# Patient Record
Sex: Male | Born: 1935 | Race: White | Hispanic: No | Smoking: Current every day smoker
Health system: Southern US, Community
[De-identification: ages and names within clinical notes are randomized; demographics above are authoritative.]

## PROBLEM LIST (undated history)

## (undated) DIAGNOSIS — D649 Anemia, unspecified: Secondary | ICD-10-CM

## (undated) DIAGNOSIS — E119 Type 2 diabetes mellitus without complications: Secondary | ICD-10-CM

## (undated) DIAGNOSIS — C4442 Squamous cell carcinoma of skin of scalp and neck: Secondary | ICD-10-CM

## (undated) DIAGNOSIS — Z9861 Coronary angioplasty status: Secondary | ICD-10-CM

## (undated) DIAGNOSIS — N289 Disorder of kidney and ureter, unspecified: Secondary | ICD-10-CM

## (undated) DIAGNOSIS — IMO0002 Reserved for concepts with insufficient information to code with codable children: Secondary | ICD-10-CM

## (undated) DIAGNOSIS — C189 Malignant neoplasm of colon, unspecified: Secondary | ICD-10-CM

## (undated) DIAGNOSIS — I2510 Atherosclerotic heart disease of native coronary artery without angina pectoris: Secondary | ICD-10-CM

## (undated) DIAGNOSIS — E785 Hyperlipidemia, unspecified: Secondary | ICD-10-CM

## (undated) DIAGNOSIS — I2109 ST elevation (STEMI) myocardial infarction involving other coronary artery of anterior wall: Secondary | ICD-10-CM

## (undated) DIAGNOSIS — H9190 Unspecified hearing loss, unspecified ear: Secondary | ICD-10-CM

## (undated) DIAGNOSIS — E039 Hypothyroidism, unspecified: Secondary | ICD-10-CM

## (undated) DIAGNOSIS — IMO0001 Reserved for inherently not codable concepts without codable children: Secondary | ICD-10-CM

## (undated) DIAGNOSIS — L570 Actinic keratosis: Secondary | ICD-10-CM

## (undated) DIAGNOSIS — E669 Obesity, unspecified: Secondary | ICD-10-CM

## (undated) DIAGNOSIS — I255 Ischemic cardiomyopathy: Secondary | ICD-10-CM

## (undated) HISTORY — DX: Unspecified hearing loss, unspecified ear: H91.90

## (undated) HISTORY — DX: Actinic keratosis: L57.0

## (undated) HISTORY — DX: Coronary angioplasty status: Z98.61

## (undated) HISTORY — DX: Atherosclerotic heart disease of native coronary artery without angina pectoris: I25.10

## (undated) HISTORY — DX: Hyperlipidemia, unspecified: E78.5

## (undated) HISTORY — DX: Obesity, unspecified: E66.9

## (undated) HISTORY — DX: Type 2 diabetes mellitus without complications: E11.9

## (undated) HISTORY — DX: Hypothyroidism, unspecified: E03.9

## (undated) HISTORY — DX: Reserved for inherently not codable concepts without codable children: IMO0001

## (undated) HISTORY — DX: Anemia, unspecified: D64.9

## (undated) HISTORY — DX: Disorder of kidney and ureter, unspecified: N28.9

## (undated) HISTORY — DX: Malignant neoplasm of colon, unspecified: C18.9

## (undated) HISTORY — DX: Squamous cell carcinoma of skin of scalp and neck: C44.42

## (undated) HISTORY — DX: Reserved for concepts with insufficient information to code with codable children: IMO0002

## (undated) HISTORY — DX: Ischemic cardiomyopathy: I25.5

## (undated) HISTORY — DX: ST elevation (STEMI) myocardial infarction involving other coronary artery of anterior wall: I21.09

---

## 2000-08-11 ENCOUNTER — Encounter: Payer: Self-pay | Admitting: Ophthalmology

## 2000-08-11 ENCOUNTER — Ambulatory Visit: Admission: RE | Admit: 2000-08-11 | Discharge: 2000-08-11 | Payer: Self-pay | Admitting: Ophthalmology

## 2000-08-22 ENCOUNTER — Encounter: Admission: RE | Admit: 2000-08-22 | Discharge: 2000-11-20 | Payer: Self-pay | Admitting: Family Medicine

## 2000-08-23 ENCOUNTER — Ambulatory Visit (HOSPITAL_COMMUNITY): Admission: RE | Admit: 2000-08-23 | Discharge: 2000-08-23 | Payer: Self-pay | Admitting: Ophthalmology

## 2001-03-15 HISTORY — PX: COLECTOMY: SHX59

## 2001-03-15 HISTORY — PX: CARDIAC CATHETERIZATION: SHX172

## 2001-08-13 DIAGNOSIS — I2109 ST elevation (STEMI) myocardial infarction involving other coronary artery of anterior wall: Secondary | ICD-10-CM

## 2001-08-13 HISTORY — DX: ST elevation (STEMI) myocardial infarction involving other coronary artery of anterior wall: I21.09

## 2001-08-14 ENCOUNTER — Inpatient Hospital Stay (HOSPITAL_COMMUNITY): Admission: EM | Admit: 2001-08-14 | Discharge: 2001-08-17 | Payer: Self-pay | Admitting: Emergency Medicine

## 2001-08-14 ENCOUNTER — Encounter: Payer: Self-pay | Admitting: Emergency Medicine

## 2001-08-15 ENCOUNTER — Encounter: Payer: Self-pay | Admitting: Cardiology

## 2001-08-16 ENCOUNTER — Encounter (INDEPENDENT_AMBULATORY_CARE_PROVIDER_SITE_OTHER): Payer: Self-pay | Admitting: Cardiology

## 2001-08-21 ENCOUNTER — Encounter: Payer: Self-pay | Admitting: Cardiology

## 2001-08-21 ENCOUNTER — Inpatient Hospital Stay (HOSPITAL_COMMUNITY): Admission: AD | Admit: 2001-08-21 | Discharge: 2001-08-30 | Payer: Self-pay | Admitting: Cardiology

## 2001-08-21 ENCOUNTER — Encounter: Admission: RE | Admit: 2001-08-21 | Discharge: 2001-08-21 | Payer: Self-pay | Admitting: Cardiology

## 2001-08-25 ENCOUNTER — Encounter: Payer: Self-pay | Admitting: Cardiology

## 2001-08-28 ENCOUNTER — Encounter: Payer: Self-pay | Admitting: Cardiology

## 2001-09-12 DIAGNOSIS — I2510 Atherosclerotic heart disease of native coronary artery without angina pectoris: Secondary | ICD-10-CM

## 2001-09-12 HISTORY — DX: Atherosclerotic heart disease of native coronary artery without angina pectoris: I25.10

## 2001-09-19 ENCOUNTER — Ambulatory Visit (HOSPITAL_COMMUNITY): Admission: RE | Admit: 2001-09-19 | Discharge: 2001-09-19 | Payer: Self-pay | Admitting: Gastroenterology

## 2001-09-20 ENCOUNTER — Encounter: Admission: RE | Admit: 2001-09-20 | Discharge: 2001-09-20 | Payer: Self-pay | Admitting: Cardiology

## 2001-09-20 ENCOUNTER — Encounter: Payer: Self-pay | Admitting: Cardiology

## 2001-09-25 ENCOUNTER — Ambulatory Visit (HOSPITAL_COMMUNITY): Admission: RE | Admit: 2001-09-25 | Discharge: 2001-09-26 | Payer: Self-pay | Admitting: Cardiology

## 2001-11-01 ENCOUNTER — Inpatient Hospital Stay (HOSPITAL_COMMUNITY): Admission: EM | Admit: 2001-11-01 | Discharge: 2001-11-15 | Payer: Self-pay | Admitting: Emergency Medicine

## 2001-11-01 ENCOUNTER — Encounter (INDEPENDENT_AMBULATORY_CARE_PROVIDER_SITE_OTHER): Payer: Self-pay | Admitting: *Deleted

## 2001-11-01 ENCOUNTER — Encounter (INDEPENDENT_AMBULATORY_CARE_PROVIDER_SITE_OTHER): Payer: Self-pay | Admitting: Specialist

## 2002-12-25 ENCOUNTER — Ambulatory Visit (HOSPITAL_COMMUNITY): Admission: RE | Admit: 2002-12-25 | Discharge: 2002-12-25 | Payer: Self-pay | Admitting: Gastroenterology

## 2002-12-25 ENCOUNTER — Encounter (INDEPENDENT_AMBULATORY_CARE_PROVIDER_SITE_OTHER): Payer: Self-pay | Admitting: *Deleted

## 2003-01-22 ENCOUNTER — Ambulatory Visit (HOSPITAL_COMMUNITY): Admission: RE | Admit: 2003-01-22 | Discharge: 2003-01-22 | Payer: Self-pay | Admitting: Ophthalmology

## 2003-03-16 HISTORY — PX: NM MYOVIEW LTD: HXRAD82

## 2005-09-24 ENCOUNTER — Ambulatory Visit: Payer: Self-pay | Admitting: Family Medicine

## 2006-01-25 ENCOUNTER — Ambulatory Visit: Payer: Self-pay | Admitting: Family Medicine

## 2006-03-15 HISTORY — PX: COLONOSCOPY: SHX174

## 2006-07-28 ENCOUNTER — Ambulatory Visit: Payer: Self-pay | Admitting: Family Medicine

## 2006-10-24 ENCOUNTER — Ambulatory Visit: Payer: Self-pay | Admitting: Family Medicine

## 2007-07-17 ENCOUNTER — Ambulatory Visit: Payer: Self-pay | Admitting: Family Medicine

## 2008-03-15 HISTORY — PX: OTHER SURGICAL HISTORY: SHX169

## 2008-03-22 ENCOUNTER — Inpatient Hospital Stay (HOSPITAL_COMMUNITY): Admission: EM | Admit: 2008-03-22 | Discharge: 2008-04-02 | Payer: Self-pay | Admitting: Emergency Medicine

## 2008-03-25 ENCOUNTER — Encounter (INDEPENDENT_AMBULATORY_CARE_PROVIDER_SITE_OTHER): Payer: Self-pay | Admitting: Internal Medicine

## 2008-03-25 ENCOUNTER — Ambulatory Visit: Payer: Self-pay | Admitting: Vascular Surgery

## 2008-03-28 ENCOUNTER — Ambulatory Visit: Payer: Self-pay | Admitting: Physical Medicine & Rehabilitation

## 2008-04-02 ENCOUNTER — Inpatient Hospital Stay (HOSPITAL_COMMUNITY)
Admission: RE | Admit: 2008-04-02 | Discharge: 2008-04-09 | Payer: Self-pay | Admitting: Physical Medicine & Rehabilitation

## 2008-04-02 ENCOUNTER — Ambulatory Visit: Payer: Self-pay | Admitting: Physical Medicine & Rehabilitation

## 2008-04-23 ENCOUNTER — Ambulatory Visit: Payer: Self-pay | Admitting: Family Medicine

## 2008-07-23 ENCOUNTER — Ambulatory Visit: Payer: Self-pay | Admitting: Family Medicine

## 2008-11-26 ENCOUNTER — Ambulatory Visit: Payer: Self-pay | Admitting: Family Medicine

## 2009-03-27 ENCOUNTER — Ambulatory Visit: Payer: Self-pay | Admitting: Family Medicine

## 2009-03-28 ENCOUNTER — Ambulatory Visit: Payer: Self-pay | Admitting: Family Medicine

## 2009-07-31 ENCOUNTER — Ambulatory Visit: Payer: Self-pay | Admitting: Family Medicine

## 2009-12-01 ENCOUNTER — Ambulatory Visit: Payer: Self-pay | Admitting: Family Medicine

## 2009-12-17 ENCOUNTER — Ambulatory Visit: Payer: Self-pay | Admitting: Family Medicine

## 2010-01-08 ENCOUNTER — Ambulatory Visit: Payer: Self-pay | Admitting: Family Medicine

## 2010-04-02 ENCOUNTER — Ambulatory Visit
Admission: RE | Admit: 2010-04-02 | Discharge: 2010-04-02 | Payer: Self-pay | Source: Home / Self Care | Attending: Family Medicine | Admitting: Family Medicine

## 2010-06-29 LAB — GLUCOSE, CAPILLARY
Glucose-Capillary: 107 mg/dL — ABNORMAL HIGH (ref 70–99)
Glucose-Capillary: 134 mg/dL — ABNORMAL HIGH (ref 70–99)
Glucose-Capillary: 135 mg/dL — ABNORMAL HIGH (ref 70–99)
Glucose-Capillary: 136 mg/dL — ABNORMAL HIGH (ref 70–99)
Glucose-Capillary: 137 mg/dL — ABNORMAL HIGH (ref 70–99)
Glucose-Capillary: 138 mg/dL — ABNORMAL HIGH (ref 70–99)
Glucose-Capillary: 140 mg/dL — ABNORMAL HIGH (ref 70–99)
Glucose-Capillary: 142 mg/dL — ABNORMAL HIGH (ref 70–99)
Glucose-Capillary: 150 mg/dL — ABNORMAL HIGH (ref 70–99)
Glucose-Capillary: 155 mg/dL — ABNORMAL HIGH (ref 70–99)
Glucose-Capillary: 158 mg/dL — ABNORMAL HIGH (ref 70–99)
Glucose-Capillary: 168 mg/dL — ABNORMAL HIGH (ref 70–99)
Glucose-Capillary: 171 mg/dL — ABNORMAL HIGH (ref 70–99)
Glucose-Capillary: 193 mg/dL — ABNORMAL HIGH (ref 70–99)
Glucose-Capillary: 197 mg/dL — ABNORMAL HIGH (ref 70–99)
Glucose-Capillary: 87 mg/dL (ref 70–99)
Glucose-Capillary: 101 mg/dL — ABNORMAL HIGH (ref 70–99)
Glucose-Capillary: 107 mg/dL — ABNORMAL HIGH (ref 70–99)
Glucose-Capillary: 118 mg/dL — ABNORMAL HIGH (ref 70–99)
Glucose-Capillary: 124 mg/dL — ABNORMAL HIGH (ref 70–99)
Glucose-Capillary: 130 mg/dL — ABNORMAL HIGH (ref 70–99)
Glucose-Capillary: 130 mg/dL — ABNORMAL HIGH (ref 70–99)
Glucose-Capillary: 154 mg/dL — ABNORMAL HIGH (ref 70–99)
Glucose-Capillary: 155 mg/dL — ABNORMAL HIGH (ref 70–99)
Glucose-Capillary: 169 mg/dL — ABNORMAL HIGH (ref 70–99)
Glucose-Capillary: 182 mg/dL — ABNORMAL HIGH (ref 70–99)
Glucose-Capillary: 190 mg/dL — ABNORMAL HIGH (ref 70–99)
Glucose-Capillary: 193 mg/dL — ABNORMAL HIGH (ref 70–99)
Glucose-Capillary: 210 mg/dL — ABNORMAL HIGH (ref 70–99)
Glucose-Capillary: 211 mg/dL — ABNORMAL HIGH (ref 70–99)
Glucose-Capillary: 214 mg/dL — ABNORMAL HIGH (ref 70–99)
Glucose-Capillary: 215 mg/dL — ABNORMAL HIGH (ref 70–99)
Glucose-Capillary: 215 mg/dL — ABNORMAL HIGH (ref 70–99)
Glucose-Capillary: 218 mg/dL — ABNORMAL HIGH (ref 70–99)
Glucose-Capillary: 234 mg/dL — ABNORMAL HIGH (ref 70–99)
Glucose-Capillary: 235 mg/dL — ABNORMAL HIGH (ref 70–99)
Glucose-Capillary: 241 mg/dL — ABNORMAL HIGH (ref 70–99)
Glucose-Capillary: 264 mg/dL — ABNORMAL HIGH (ref 70–99)
Glucose-Capillary: 267 mg/dL — ABNORMAL HIGH (ref 70–99)
Glucose-Capillary: 45 mg/dL — ABNORMAL LOW (ref 70–99)
Glucose-Capillary: 70 mg/dL (ref 70–99)
Glucose-Capillary: 83 mg/dL (ref 70–99)
Glucose-Capillary: 91 mg/dL (ref 70–99)
Glucose-Capillary: 99 mg/dL (ref 70–99)

## 2010-06-29 LAB — CBC
HCT: 34.8 % — ABNORMAL LOW (ref 39.0–52.0)
HCT: 35 % — ABNORMAL LOW (ref 39.0–52.0)
HCT: 39.9 % (ref 39.0–52.0)
HCT: 36.7 % — ABNORMAL LOW (ref 39.0–52.0)
HCT: 38.1 % — ABNORMAL LOW (ref 39.0–52.0)
Hemoglobin: 11.6 g/dL — ABNORMAL LOW (ref 13.0–17.0)
Hemoglobin: 12.1 g/dL — ABNORMAL LOW (ref 13.0–17.0)
Hemoglobin: 12.4 g/dL — ABNORMAL LOW (ref 13.0–17.0)
MCHC: 33.8 g/dL (ref 30.0–36.0)
MCHC: 34.3 g/dL (ref 30.0–36.0)
MCHC: 32.5 g/dL (ref 30.0–36.0)
MCHC: 33 g/dL (ref 30.0–36.0)
MCV: 90.8 fL (ref 78.0–100.0)
MCV: 91.7 fL (ref 78.0–100.0)
MCV: 91.8 fL (ref 78.0–100.0)
MCV: 92.1 fL (ref 78.0–100.0)
Platelets: 165 10*3/uL (ref 150–400)
Platelets: 169 10*3/uL (ref 150–400)
Platelets: 184 10*3/uL (ref 150–400)
Platelets: 190 10*3/uL (ref 150–400)
RBC: 3.76 MIL/uL — ABNORMAL LOW (ref 4.22–5.81)
RBC: 4.14 MIL/uL — ABNORMAL LOW (ref 4.22–5.81)
RDW: 13.4 % (ref 11.5–15.5)
RDW: 13.4 % (ref 11.5–15.5)
RDW: 13.6 % (ref 11.5–15.5)
RDW: 13 % (ref 11.5–15.5)
RDW: 13.1 % (ref 11.5–15.5)
RDW: 13.3 % (ref 11.5–15.5)
WBC: 9.6 10*3/uL (ref 4.0–10.5)

## 2010-06-29 LAB — BASIC METABOLIC PANEL
BUN: 16 mg/dL (ref 6–23)
BUN: 17 mg/dL (ref 6–23)
BUN: 21 mg/dL (ref 6–23)
CO2: 22 mEq/L (ref 19–32)
CO2: 25 mEq/L (ref 19–32)
CO2: 25 mEq/L (ref 19–32)
CO2: 30 mEq/L (ref 19–32)
Calcium: 8.5 mg/dL (ref 8.4–10.5)
Calcium: 8.6 mg/dL (ref 8.4–10.5)
Calcium: 9.4 mg/dL (ref 8.4–10.5)
Chloride: 103 mEq/L (ref 96–112)
Chloride: 103 mEq/L (ref 96–112)
Chloride: 106 mEq/L (ref 96–112)
Chloride: 103 mEq/L (ref 96–112)
GFR calc Af Amer: 45 mL/min — ABNORMAL LOW (ref >60)
GFR calc Af Amer: 54 mL/min — ABNORMAL LOW (ref >60)
GFR calc non Af Amer: 37 mL/min — ABNORMAL LOW (ref >60)
GFR calc non Af Amer: 41 mL/min — ABNORMAL LOW (ref >60)
Glucose, Bld: 125 mg/dL — ABNORMAL HIGH (ref 70–99)
Glucose, Bld: 137 mg/dL — ABNORMAL HIGH (ref 70–99)
Glucose, Bld: 154 mg/dL — ABNORMAL HIGH (ref 70–99)
Glucose, Bld: 101 mg/dL — ABNORMAL HIGH (ref 70–99)
Glucose, Bld: 132 mg/dL — ABNORMAL HIGH (ref 70–99)
Potassium: 4.2 mEq/L (ref 3.5–5.1)
Potassium: 4.2 mEq/L (ref 3.5–5.1)
Potassium: 4.4 mEq/L (ref 3.5–5.1)
Potassium: 3.8 mEq/L (ref 3.5–5.1)
Potassium: 4.1 mEq/L (ref 3.5–5.1)
Sodium: 131 mEq/L — ABNORMAL LOW (ref 135–145)
Sodium: 135 mEq/L (ref 135–145)
Sodium: 136 mEq/L (ref 135–145)
Sodium: 136 mEq/L (ref 135–145)
Sodium: 137 mEq/L (ref 135–145)
Sodium: 139 mEq/L (ref 135–145)

## 2010-06-29 LAB — DIFFERENTIAL
Basophils Absolute: 0 10*3/uL (ref 0.0–0.1)
Eosinophils Absolute: 0.1 10*3/uL (ref 0.0–0.7)
Eosinophils Relative: 1 % (ref 0–5)
Lymphocytes Relative: 22 % (ref 12–46)
Lymphocytes Relative: 23 % (ref 12–46)
Lymphs Abs: 2.1 10*3/uL (ref 0.7–4.0)
Lymphs Abs: 2.4 10*3/uL (ref 0.7–4.0)
Monocytes Absolute: 0.8 10*3/uL (ref 0.1–1.0)
Monocytes Absolute: 1.1 10*3/uL — ABNORMAL HIGH (ref 0.1–1.0)
Monocytes Relative: 10 % (ref 3–12)
Neutro Abs: 6.3 10*3/uL (ref 1.7–7.7)

## 2010-06-29 LAB — URINE CULTURE
Colony Count: NO GROWTH
Special Requests: NEGATIVE

## 2010-06-29 LAB — URINALYSIS, ROUTINE W REFLEX MICROSCOPIC
Bilirubin Urine: NEGATIVE
Bilirubin Urine: NEGATIVE
Bilirubin Urine: NEGATIVE
Hgb urine dipstick: NEGATIVE
Ketones, ur: 15 mg/dL — AB
Ketones, ur: NEGATIVE mg/dL
Leukocytes, UA: NEGATIVE
Nitrite: NEGATIVE
Nitrite: NEGATIVE
Protein, ur: 100 mg/dL — AB
Specific Gravity, Urine: 1.019 (ref 1.005–1.030)
Urobilinogen, UA: 0.2 mg/dL (ref 0.0–1.0)
Urobilinogen, UA: 0.2 mg/dL (ref 0.0–1.0)
pH: 5.5 (ref 5.0–8.0)
pH: 5.5 (ref 5.0–8.0)

## 2010-06-29 LAB — COMPREHENSIVE METABOLIC PANEL
ALT: 45 U/L (ref 0–53)
AST: 21 U/L (ref 0–37)
AST: 29 U/L (ref 0–37)
AST: 31 U/L (ref 0–37)
Albumin: 4 g/dL (ref 3.5–5.2)
Albumin: 2.6 g/dL — ABNORMAL LOW (ref 3.5–5.2)
BUN: 16 mg/dL (ref 6–23)
BUN: 26 mg/dL — ABNORMAL HIGH (ref 6–23)
CO2: 26 mEq/L (ref 19–32)
CO2: 28 mEq/L (ref 19–32)
Calcium: 9.4 mg/dL (ref 8.4–10.5)
Calcium: 9 mg/dL (ref 8.4–10.5)
Chloride: 100 mEq/L (ref 96–112)
Chloride: 102 mEq/L (ref 96–112)
Creatinine, Ser: 1.81 mg/dL — ABNORMAL HIGH (ref 0.4–1.5)
Creatinine, Ser: 2.03 mg/dL — ABNORMAL HIGH (ref 0.4–1.5)
GFR calc Af Amer: 37 mL/min — ABNORMAL LOW (ref >60)
GFR calc Af Amer: 43 mL/min — ABNORMAL LOW (ref >60)
GFR calc Af Amer: 48 mL/min — ABNORMAL LOW (ref >60)
GFR calc non Af Amer: 31 mL/min — ABNORMAL LOW (ref >60)
GFR calc non Af Amer: 35 mL/min — ABNORMAL LOW (ref >60)
Sodium: 137 mEq/L (ref 135–145)
Total Bilirubin: 0.7 mg/dL (ref 0.3–1.2)
Total Bilirubin: 0.8 mg/dL (ref 0.3–1.2)
Total Protein: 5.6 g/dL — ABNORMAL LOW (ref 6.0–8.3)

## 2010-06-29 LAB — CULTURE, BLOOD (ROUTINE X 2)
Culture: NO GROWTH
Culture: NO GROWTH

## 2010-06-29 LAB — BRAIN NATRIURETIC PEPTIDE: Pro B Natriuretic peptide (BNP): 249 pg/mL — ABNORMAL HIGH (ref 0.0–100.0)

## 2010-06-29 LAB — CK TOTAL AND CKMB (NOT AT ARMC)
CK, MB: 4.2 ng/mL — ABNORMAL HIGH (ref 0.3–4.0)
Relative Index: 1.2 (ref 0.0–2.5)

## 2010-06-29 LAB — CARDIAC PANEL(CRET KIN+CKTOT+MB+TROPI)
CK, MB: 8.6 ng/mL — ABNORMAL HIGH (ref 0.3–4.0)
Relative Index: 0.9 (ref 0.0–2.5)
Relative Index: 1.1 (ref 0.0–2.5)
Total CK: 815 U/L — ABNORMAL HIGH (ref 7–232)
Troponin I: 0.04 ng/mL (ref 0.00–0.06)
Troponin I: 0.04 ng/mL (ref 0.00–0.06)

## 2010-06-29 LAB — RENAL FUNCTION PANEL
Albumin: 3 g/dL — ABNORMAL LOW (ref 3.5–5.2)
Chloride: 103 mEq/L (ref 96–112)
GFR calc Af Amer: 46 mL/min — ABNORMAL LOW (ref >60)
GFR calc non Af Amer: 38 mL/min — ABNORMAL LOW (ref >60)
Potassium: 4.4 mEq/L (ref 3.5–5.1)

## 2010-06-29 LAB — LIPID PANEL
HDL: 20 mg/dL — ABNORMAL LOW (ref >39)
LDL Cholesterol: 59 mg/dL (ref 0–99)
Total CHOL/HDL Ratio: 5.4 RATIO
Triglycerides: 142 mg/dL (ref <150)
VLDL: 28 mg/dL (ref 0–40)

## 2010-06-29 LAB — FOLATE: Folate: >20 ng/mL

## 2010-06-29 LAB — POCT CARDIAC MARKERS
CKMB, poc: 2 ng/mL (ref 1.0–8.0)
Troponin i, poc: <0.05 ng/mL (ref 0.00–0.09)

## 2010-06-29 LAB — TROPONIN I: Troponin I: 0.03 ng/mL (ref 0.00–0.06)

## 2010-06-29 LAB — URINE MICROSCOPIC-ADD ON

## 2010-06-29 LAB — DIGOXIN LEVEL: Digoxin Level: 0.5 ng/mL — ABNORMAL LOW (ref 0.8–2.0)

## 2010-07-09 ENCOUNTER — Encounter: Payer: Self-pay | Admitting: Family Medicine

## 2010-07-09 DIAGNOSIS — B353 Tinea pedis: Secondary | ICD-10-CM | POA: Insufficient documentation

## 2010-07-28 NOTE — Discharge Summary (Signed)
NAME:  Kyle Parrish, BIRDSELL NO.:  [id number]   MEDICAL RECORD NO.:  [id number]          PATIENT TYPE:  IPS   LOCATION:  4002                         FACILITY:  [facility]   PHYSICIAN:  Ranelle Oyster, M.D.DATE OF BIRTH:  May 28, 1912   DATE OF ADMISSION:  04/02/2008  DATE OF DISCHARGE:  04/09/2008                               DISCHARGE SUMMARY   DISCHARGE DIAGNOSES:  1. Acute mental status changes with deconditioning secondary to      urinary tract infection, bronchitis, and gout flare as well as      renal insufficiency.  2. Acute-on-chronic renal insufficiency, improved.  3. Gout.  4. Diabetes mellitus type 2.   HISTORY OF PRESENT ILLNESS:  Kyle Parrish is a 77 year old male with  history of diabetes mellitus and coronary artery disease, admitted on  March 22, 2008, with confusion, slurred speech, and difficulty walking.  MRI/MRA of brain showed no acute changes, atrophy, and non-visualization  of right vertebral artery.  The patient was noted to be dehydrated with  fever at admission with question of UTI.  He was started on IV Rocephin.  Initially, he was noted to have issues with confusion and agitation as  mental status started clearing.  Swallow eval was done and diet was  initiated.  A chest x-ray done on March 27, 2008, showed low lung  volume with questionable mild bronchitis versus reactive airway disease.  The patient's respiratory status has been stable.  He did develop left  foot pain secondary to gout flare and was started on Solu-Medrol on  March 29, 2008, currently, he is on a p.o. taper.  The patient noted  to be continued with intermittent confusion.  Therapies initiated and  the patient is noted to have decrease in balance, low with ADL tasks.  Requires cues for posture and stability.  He was also noted to have  arrhythmia on telemetry with few beats of V-tach and his Coreg dose was  increased with resolution of arrhythmia.  The patient was  evaluated by  physiatrist and felt to be an appropriate candidate for inpatient rehab.   PAST MEDICAL HISTORY:  Significant for DM type 2, coronary artery  disease, colon cancer with resection in 2003, history of GI bleed in  2003, gout, glaucoma, excision of cataracts, and history of CHF.   ALLERGIES:  No known drug allergies.   FAMILY HISTORY:  Noncontributory secondary to advanced age.   SOCIAL HISTORY:  The patient lives in 1-level home with no steps at  entry.  Smokes 2 cigars a day.  Does not use any alcohol.  He is a  retired [employer].  Family is very supportive and will  provide 24-hour supervision past discharge.   FUNCTIONAL HISTORY:  The patient was independent prior to admission.  He  does not drive.   PHYSICAL EXAMINATION:  VITAL SIGNS:  Blood pressure 153/67, pulse 60,  respiratory rate 20, and temperature 97.5.  GENERAL:  The patient is a pleasant elderly male, sitting up in bed, in  no acute distress.  HEENT:  Pupils irregular, but reactive to light, left more  than right.  Some decrease in hearing noted.  Nares patent.  Oral exam shows missing  teeth with white coating on part of tongue.  Oral mucosa moist.  NECK:  Supple without JVD or lymphadenopathy.  CHEST:  Clear to auscultation except for few scattered crackles at  bases.  HEART:  Regular rate and rhythm with systolic murmur.  EXTREMITIES:  No evidence of edema, cyanosis, or clubbing.  ABDOMEN:  Soft and nontender with positive bowel sounds.  MUSCULOSKELETAL:  Some pain in bilateral first MTP joint with mild  redness and warmth.  NEUROLOGIC: Cranial nerves II-XII show regular pupils, seems to have  mild right facial asymmetry.  Reflexes 1+.  Sensation grossly intact.  Judgment fair.  He is oriented to name and place.  Memory is borderline.  Mood generally pleasant.  Motor exam notable for 4-/5 strength in  bilateral upper extremities.  Bilateral lower extremities at 3/5  proximally, 4/5  distally.   HOSPITAL COURSE:  Mr. Kyle Parrish was admitted to rehab on April 02, 2008, for inpatient therapies to consist of PT, OT, and speech  therapy at least 3 hours 5 days a week.  Past admission, physiatrist,  rehab RN, and therapy team have worked together to provide customized  collaborative interdisciplinary care.  During the patient's stay, weekly  team conferences were held to discuss the patient's progress, set goals  as well as discuss barriers to discharge.  Rehab RN has been assisting  with skin care with emphasis on prevention of any ooze.  They have also  been assisting with bowel and bladder training with toileting patient  q.3-4 h. to maintain continence.  They have also been emphasizing safety  issues with the patient to call for assist prior to getting up.  Due to  the patient's poor safety awareness, initially a bed alarm was placed to  help the patient remember to call for assist.  The patient's blood  pressures were checked on b.i.d. basis during this stay.  These are  noted to be reasonably controlled ranging from 120s-140s systolic and  70s diastolic.  Heart rate has been stable in 50s-60s range.  Last  weight is 67 kg.  Labs done past admission revealed hemoglobin 11.2,  hematocrit 34.5, white count 10.7, and platelets 190.  Check of lytes  revealed sodium 137, potassium 3.8, chloride 103, CO2 28, BUN 37,  creatinine 1.64, and glucose 138.  Rehab RN has also been assisting with  following of the patient's CBGs with blood sugars initially noted to be  ranging from 110-200 range.  As the patient's steroids had started to be  tapered, the patient reported increase in complaints of foot pain.  Steroids were increased to 20 mg again briefly with further elevation in  blood sugars.  Attempts to get tighter blood sugar control with  increasing Glucotrol ended up with hypoglycemic episodes with the  patient's blood sugars dropping in 50s and 60s range.  The  patient has  been tapered off steroids by the time of discharge.  He was started on  colchicine 0.6 mg p.o. per day.  Blood sugars at the time of discharge  ranged from 70s-60s range.  Anticipate with the patient's improving  mobility and recovery from discontinuation of steroids.  Blood sugar  should normalize nicely.   The patient was noted to have frequency at the time of admission and  nursing checks the patient's PVRs with bladder scan.  The patient was  noted to have volumes up  to 500 mL requiring in-and-out cath past  admission.  He was started on Flomax 0.4 mg at bedtime with improvement  in his PVRs.  Last check of PVRs noted to be at 40 to 130 mL.  At the  time of admission, the patient was noted to be impaired by decreased  endurance, decrease in strength, decrease in balance as well as pain in  bilateral feet causing difficulty walking.  The patient required min  assist for bed mobility, mod assist for sit to stand, transfers required  upper extremity support for static balance due to posterior lean.  He  was able to ambulate 30 feet with rolling walker at min-to-mod assist  with very short steps stride length.  He did have complaints of left  foot pain and rehab RN assisted with premedicating the patient prior to  therapy.  Additionally, with increase in steroids, his gout symptoms did  resolve which helped to improve his mobility overall.  At the time of  discharge, the patient had progressed along to being at supervision  level for bed mobility, close supervision for ambulating 96 feet x2  without assisted device.  He was able to practice navigating a curb with  supervision with loss of balance x2 due to not having his glasses.  Once  glasses were down, he was noted to have improved in balance.  Safety  regarding car transfers and curb navigation were discussed with the  patient's daughter.  Family is to provide 24-hour supervision past  discharge.  OT evaluation at  admission revealed the patient with  problems with sequencing and organizing ADL tasks, decrease in insight  with poor safety awareness, and generalized weakness.  The patient was  noted to have loss of balance in sitting at the edge of the bed.  He was  min to max assist for standing balance.  ADL training has focused on  safety as well as dynamic standing balance without assisted device.  By  the time of discharge, the patient was able to perform ADL activities  with increase in dynamic standing balance.  He was modified independent  for upper body dressing and grooming.  He required steady assist for  navigating uneven surfaces without assisted device.  The patient's  daughter underwent OT education with emphasis on providing supervision  for safety at home.  Speech therapy evaluation revealed the patient with  mild oral phase dysphagia with decrease in masticating ability.  Cognition was __________ decrease in high level skills with problems  with attention, awareness as well as problem solving and executive  functioning.  Suspect hearing loss could be impacting his comprehension.  The patient was independent for basic comprehension as well as following  2-step commands, unable to follow 3-step commands due to poor auditory  memory versus hearing loss.  Verbal expression was intact.  High-level  expression regarding picture of functional scenario required min assist  due to poor vision.  He was initially noted to have decreased awareness  with max assist for complex verbal recall.  He required min assist for  sequencing and organizing.  He required min assist for complex problem  solving such as counting money and max cues.  By the time of discharge,  the patient was independently able to perform functional math.  He was  able to sustain selective attention in distracting environment without  difficulty.  Further followup home health [facility].  On April 09, 2008, the patient  is discharged to home.   DISCHARGE MEDICATIONS:  1. Coated aspirin 81 mg a day.  2. Coreg 12.5 mg b.i.d.  3. Lanoxin 0.125 mg at bedtime.  4. Folic acid 1 mg a day.  5. Actos 15 mg a day.  6. Synthroid 88 mcg a day.  7. Flomax 0.4 mg at bedtime.  8. Colchicine 0.6 mg a day.  9. Glucotrol XL 5 mg a day.  10.Timoptic 0.25% one drop both eyes b.i.d.   DIET:  Diabetic diet.   ACTIVITY LEVEL:  24-hour supervision and no strenuous activity.  No  alcohol.  No smoking.  No driving.   SPECIAL INSTRUCTIONS:  Do not use Indocin.  Drink plenty of fluids.  [facility] to provide [facility].   FOLLOWUP:  The patient to follow up with Dr. Riley Kill as needed.  Follow  up with Dr. Susann Givens for routine check in 2 weeks.       Greg Cutter, P.A.      Ranelle Oyster, M.D.  Electronically Signed    PP/MEDQ  D:  04/09/2008  T:  04/10/2008  Job:  [id number]   cc:   Sharlot Gowda, M.D.

## 2010-07-28 NOTE — Discharge Summary (Signed)
NAME:  Kyle Parrish, Kyle Parrish NO.:  [id number]   MEDICAL RECORD NO.:  [id number]          PATIENT TYPE:  INP   LOCATION:  5504                         FACILITY:  [facility]   PHYSICIAN:  Isidor Holts, M.D.  DATE OF BIRTH:  1913/01/15   DATE OF ADMISSION:  03/22/2008  DATE OF DISCHARGE:  04/02/2008                               DISCHARGE SUMMARY   ADDENDUM:   PMD:  Sharlot Gowda, M.D.   DISCHARGE DIAGNOSIS:  Refer to interim summary dictated March 31, 2008, by Dr. Elliot Cousin.   Refer to above summary for details of procedures, consultations,  admission history, and clinical course.   The patient was clinically stable on April 01, 2008.  There were no  new issues.  CBGs were ranging between 110 and 189 necessitating  increase in Lantus insulin to 10 units subcutaneously q.h.s.  The  patient was on day #8 of Rocephin, which was considered adequate for  treatment of urinary tract infection, particularly as this has remained  culture negative.  He was afebrile, and although he did have a mild  leukocytosis, which was considered attributable to steroid treatment, he  no longer has any symptoms referable to gout.  Blood pressure was  suboptimal at 159/71 on April 01, 2008.  Coreg has therefore been  adjusted upward.  The patient's creatinine has improved still further,  and was 1.48 on April 01, 2008. Likely this is the patient's baseline.  There have been no problems referable to coronary artery disease, and  the patient is tolerating a dysphagia 3 diet with thin liquids.  Mental  status as of April 01, 2008, appeared close to baseline, and  clinically his acute bronchitis appeared to have resolved.   DISPOSITION:  The patient was seen by the rehabilitation team on April 01, 2008, and considered a suitable candidate for inpatient rehab/CIR,  and provided no acute problems arise in the interim, the patient will be  discharged on April 02, 2008, for  inpatient rehabilitation.   DISCHARGE MEDICATIONS:  1. Timoptic 0.25% ophthalmic solution 1 drop both eyes b.i.d.  2. Aspirin 81 mg p.o. daily.  3. Albuterol nebulizer 2.5 mg p.r.n. q. 4-6 hours.  4. Digoxin 125 mcg p.o. daily.  5. Synthroid 88 mcg p.o. daily.  6. Coreg 12.5 mg p.o. b.i.d.  7. Actos 15 mg p.o. daily.  8. Lantus insulin 10 units subcutaneously q.h.s.  9. Folic acid 1 mg p.o. daily.  10.Sliding scale insulin coverage with NovoLog as follows:  CBG 200-      250 three units, CBG 251-300 five units, CBG 301-350 seven units,      CBG 351-400 nine units.   DIET:  Heart-healthy/Carbohydrate modified.  D3 dysphagia diet/Thin  liquids.   ACTIVITY:  Per PT/OT/Rehab.   FOLLOWUP INSTRUCTIONS:  The patient is to follow up with his primary MD,  Dr. Sharlot Gowda routinely, following discharge, or as otherwise  specified by Rehab MD, Dr. Thomasena Edis.      Isidor Holts, M.D.  Electronically Signed     CO/MEDQ  D:  04/01/2008  T:  04/01/2008  Job:  [id number]  cc:   Sharlot Gowda, M.D.

## 2010-07-28 NOTE — Group Therapy Note (Signed)
NAME:  Kyle Parrish, Kyle Parrish NO.:  [id number]   MEDICAL RECORD NO.:  [id number]          PATIENT TYPE:  INP   LOCATION:  5504                         FACILITY:  [facility]   PHYSICIAN:  Elliot Cousin, M.D.    DATE OF BIRTH:  1912/04/24                                 PROGRESS NOTE   DISCHARGE DIAGNOSES:  1. Altered mental status/delirium.  Etiology multifactorial, including      a possible transient ischemic attack, urinary tract infection,      volume depletion, and acute renal insufficiency.  2. Urinary tract infection.  3. Bronchospasms with mild bronchitis.  4. Dysphagia secondary to delirium, resolved.  5. Type 2 diabetes mellitus.  6. Acute on chronic renal insufficiency.  7. Coronary artery disease and hypertension.  8. Mild left internal carotid artery stenosis.  9. Do Not Resuscitate status.  10.Deconditioning.   DISCHARGE MEDICATIONS:  To be dictated at the time of hospital discharge.   CONSULTATIONS:  Rehabilitation physician, Dr. Thomasena Edis.   PROCEDURES PERFORMED:  1. Chest x-ray on March 27, 2008.  The results reveal borderline      airway thickening, query mild bronchitis or reactive airways      disease.  No airspace opacity is identified.  2. 2-D echocardiogram performed on period March 25, 2008.  The      results revealed overall left ventricular systolic function was      normal.  No left ventricular regional wall motion abnormalities.      The left ventricular wall thickness was mildly to moderately      increased.  Aortic valve was mildly calcified.  3. Carotid Doppler study on March 25, 2008.  The results revealed no      right ICA stenosis.  Left 40-60% ICA stenosis.  4. MRI of the brain and MRA of the head on March 23, 2008.  The      results revealed motion degraded exam.  No acute infarct.  Atrophy      without hydrocephalus.  Nonvisualization of the right vertebral      artery which may be occluded.  Nonvisualization of the left PICA      and both left ICAs.  Moderate branch vessel irregularity.  5. CT scan of the head on March 23, 2007.  The results revealed      inflammatory changes in the paranasal sinuses.  Otherwise no acute      intracranial pathology.   HISTORY OF PRESENT ILLNESS:  The patient is a 77 year old man with a past medical history significant  for coronary artery disease, type 2 diabetes mellitus, gout and  hypertension.  He presented to the emergency department on March 22, 2008 with a report of confusion by his family.  Apparently, the patient  had been self-sufficient and self-reliant.  However, his family noticed  on March 22, 2008 that he had become confused.  He had difficulty  walking and had some evidence of slurred speech.  When he was evaluated  in the emergency department, he was noted to be afebrile and  hemodynamically stable.  A CT scan of the head was ordered  and it  revealed no acute intracranial findings.  His lab data were virtually  unremarkable with the exception of a creatinine that was elevated at  2.0.  His initial cardiac markers were negative.  His EKG revealed no  acute ST or T-wave abnormalities.  The patient was admitted for further  evaluation and management.   For additional details please see the dictated history and physical.   HOSPITAL COURSE:  1. ALTERED MENTAL STATUS/DELIRIUM.  The patient was started on      maintenance IV fluids and supportive treatment.  For agitation, as      needed Haldol was administered.  As indicated above, the CT scan of      his head was nonacute.  For further evaluation, a number of      laboratory studies were ordered as well as an MRI of the brain,      carotid Dopplers, and a 2-D echocardiogram.  His digoxin level was      low at 0.5.  His cardiac enzymes were within normal limits with      regards to the troponin I, although the CK was elevated ranging      from 700-800.  The CK-MB was slightly elevated.  However, the       relative indices were well within normal limits.  The patient      effectively ruled out for myocardial infarction.  His fasting lipid      panel revealed a total cholesterol of 107, triglycerides of 142,      HDL cholesterol of 20, and LDL cholesterol of 59.  His hemoglobin      A1c was 5.9.  His TSH was within normal limits at 1.847.  His      homocystine was slightly elevated at 20.1.  He was later started on      folic acid 400 mcg daily.  His urinalysis revealed a moderate      leukocytes and moderate blood, status post Foley catheter      insertion.  He was, therefore, started on Rocephin empirically for      a urinary tract infection.  His BNP was slightly elevated at 249.      The MRI of his brain revealed no acute stroke.  The MRA of his head      was suggestive of a possible occlusion of the right vertebral      artery, although it was not conclusive.  His carotid Doppler study      revealed mild ICA stenosis on the left and no ICA stenosis on the      right.  He was started on aspirin therapy.  The 2-D echocardiogram      revealed preserved LV function.  Over the course of the      hospitalization, the patient's sensorium and mental status improved      significantly.  Following IV fluid volume repletion, antibiotic      therapy, and supportive treatment, his altered mental status      completely resolved.  The etiology of his delirium has not been      clearly confirmed, although more than likely, it was a combination      of multiple etiologies.  2. URINARY TRACT INFECTION.  As indicated above, the patient's      urinalysis revealed white blood cells and red blood cells.  A urine      culture was ordered and it was negative.  Blood cultures were also  ordered and they have remained negative as well.  He was started on      treatment empirically with Rocephin 1 gram IV daily.  As of today,      he has completed 7 days of therapy.  He should continue on      antibiotic  therapy for a total of 10-14 days.  The Foley catheter      has been discontinued.  He is urinating spontaneously into a condom      catheter.  He has no complaints of painful urination.  3. BRONCHOSPASM WITH PROBABLE MILD BRONCHITIS.  The patient developed      bronchospasms as auscultated on exam.  A chest x-ray was ordered on      March 27, 2008 and it revealed borderline airway thickening.  The      patient was, therefore, started on nebulizer treatments.  As of      today, his lungs are clear.  4. DYSPHAGIA SECONDARY TO ALTERED MENTAL STATUS.  The patient was      assessed by the speech therapist during the first 2 days of the      hospitalization.  Because of his ongoing delirium, the speech      therapist recommended nothing by mouth.  As the patient's mental      status improved, the speech therapist performed reevaluations.  As      of 2 days ago, his diet has been advanced to a dysphagia 3 with      thin liquids.  He is currently tolerating the diet well.  5. TYPE 2 DIABETES MELLITUS.  The patient's capillary blood glucose      has been under fair to good control.  Actos and glipizide were      initially withheld.  Actos was subsequently restarted yesterday.      The glipizide remains on hold.  He is also being managed with      sliding scale NovoLog and Lantus.  Upon discharge, insulin can      probably be discontinued.  His hemoglobin A1c was 5.9.  6. ACUTE ON CHRONIC RENAL INSUFFICIENCY.  As indicated above, the      patient's creatinine was 2.0 at the time of the initial hospital      assessment.  He was started on IV fluid volume repletion.  On      March 29, 2008, his creatinine improved to 1.56.  The rate of IV      fluids has been tapered down.  Obviously, he was prerenal.  His      urine output has been excellent.  7. CORONARY ARTERY DISEASE AND HYPERTENSION.  The patient's cardiac      enzymes were negative.  Coreg was restarted at 6.25 mg b.i.d.  He      does  have mild systolic hypertension.  However, in the setting of      altered mental status and a possible transient ischemic attack,      tight control of his blood pressure was not pursued.  His blood      pressure has been ranging in the lower 150s systolically.  If need      be, the dose of Coreg can be titrated accordingly.  His left      ventricular function is within normal limits per the 2-D      echocardiogram.  8. DECONDITIONING.  The patient has become very deconditioned.  He was      evaluated by rehabilitation  medicine physician, Dr. Thomasena Edis.      Initially it was felt that the patient may require skilled nursing.      However, as each day goes by, the patient appears to become a bit      stronger.  He is not back to baseline and will need further      strengthening in a skilled environment or in the inpatient      rehabilitation unit at [facility].  The patient's family      is aware of his deconditioned state and is in agreement with short-      term rehab.  9. ACUTE GOUT.  The patient complained of right foot pain and      swelling.  Upon exam, there was acute inflammation around the right      MTP joint.  He was started on a prednisone taper empirically.  Over      the past 48 hours, the extent of erythema, edema and tenderness      have subsided.  10.DISCHARGE DISPOSITION.  The patient is approaching medical      stability.  He is still quite deconditioned.  The plan is to      discharge him to either the inpatient rehabilitation unit at [facility] or to a local skilled nursing facility of choice.      Elliot Cousin, M.D.  Electronically Signed     DF/MEDQ  D:  03/31/2008  T:  03/31/2008  Job:  [id number]   cc:   Sharlot Gowda, M.D.  Fax: [phone]   [facility]

## 2010-07-28 NOTE — H&P (Signed)
NAME:  Kyle Parrish, Kyle Parrish NO.:  [id number]   MEDICAL RECORD NO.:  [id number]          PATIENT TYPE:  EMS   LOCATION:  MAJO                         FACILITY:  [facility]   PHYSICIAN:  Vania Rea, M.D. DATE OF BIRTH:  1913/03/02   DATE OF ADMISSION:  03/22/2008  DATE OF DISCHARGE:                              HISTORY & PHYSICAL   PRIMARY CARE PHYSICIAN:  Sharlot Gowda, M.D.   CHIEF COMPLAINT:  Confusion since this morning.   HISTORY OF PRESENT ILLNESS:  This is a 77 year old Caucasian gentleman  who is usually very active around the home, mentally very alert, reads 3  novels each day, usually reads at least 3 newspapers each day.  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  The family noticed since last night, they thought his  speech was somewhat slurred and then around 2 o'clock this afternoon,  they noted that he seemed to be having difficulty walking and was very  confused.  He seemed to be able to remember some things very well, but  was asking some very strange questions, has difficulty remembering some  words, also seems to be picking at some things in the air and acting  ugly.  The patient was brought to the emergency room for evaluation.  There is no history of fever, cough or cold.  No chest pains or  shortness of breath.  No palpitations.  There is no history of syncope  or fall.   PAST MEDICAL HISTORY:  1. Coronary artery disease status post remote MI.  2. Diabetes type 2.  3. Remote history of GI bleed.  4. Remote history of gout.  5. Remote history of colon cancer status post resection of the cecum      in 2003.  6. History of bilateral cataract implants   MEDICATIONS:  1. Actos 15 mg daily.  2. Glipizide 5 mg daily.  3. Carvedilol 6.25 mg twice daily.  4. Levothyroxine 88 mcg daily.  5. Lanoxin 0.125 mg daily.  6. Timolol eye drops 1 drop in each eye.  7. Timoptic 0.25% 1 drop in both  eyes.   ALLERGIES:  NO KNOWN DRUG ALLERGIES.   SOCIAL HISTORY:  He chews cigars.  There is no history of alcohol or  illicit drug use.  He is a retired Soil scientist.   FAMILY HISTORY:  All family members lived to be over 87, some of his  siblings are still alive and there is no history of medical problems of  the family.   REVIEW OF SYSTEMS:  He has been having a chronic cough for some time.  He complains of shortness of breath. Other than this, a 10-point review  of systems other than noted above is unremarkable.   PHYSICAL EXAMINATION:  GENERAL:  Agitated elderly Caucasian gentleman  lying in the stretcher in no acute distress.  VITAL SIGNS:  Temperature is 96.8, pulse 60, respiration 18, blood  pressure 150/54, saturating 97% on 2 liters.  HEENT:  Pupils are round and equal.  Mucous membranes pink.  Anicteric.  NECK:  No cervical lymphadenopathy or thyromegaly.  No jugular venous  distention.  CHEST:  Clear to auscultation bilaterally.  CARDIOVASCULAR:  Regular rhythm.  No murmur heard.  ABDOMEN:  Obese, soft and nontender.  There are no masses.  EXTREMITIES:  Without edema.  He has 2+ pulses bilaterally.  CENTRAL NERVOUS SYSTEM:  To the best we can tell, he does have a slight  right facial droop.  There are no cranial nerve abnormalities,  otherwise.  Note, he does have a gag.  He moves all limbs equally and  there does not appear to be focal weakness.  He does not cooperate  sufficiently to check deep tendon reflexes.   LABORATORY DATA:  White count is 9.6, hemoglobin 13.5, platelets 184, he  has a normal differential.  Serum chemistry:  Sodium is 135, potassium  4.6, chloride 100, CO2 26, glucose 91, BUN 26, creatinine 2.0.  His  liver functions are essentially normal.  His bilirubin is 0.7.  Cardiac  enzymes:  Myoglobin is greater than 500, but otherwise normal.  Urinalysis:  Total protein is 100.  It is otherwise normal.   DIAGNOSTICS:  CT scan of the brain  shows inflamed paranasal sinus, but  no acute intracranial pathology.   ASSESSMENT:  1. Probable acute cerebrovascular accident.  2. Renal failure acute versus chronic, probably contributing to mental      status changes.  3. History of hypertension.  4. History of diabetes.  5. History of coronary artery disease.   PLAN:  We will admit this gentleman to the neurology unit.  We will  hydrate him and we will check serial CBGs, but we will withhold  antihypertensives for the time being.  We will withhold active treatment  of his blood sugar unless the blood sugar rises very high.  Other plans  as per orders.      Vania Rea, M.D.  Electronically Signed     LC/MEDQ  D:  03/22/2008  T:  03/23/2008  Job:  [id number]

## 2010-07-28 NOTE — H&P (Signed)
NAME:  Kyle Parrish, Kyle Parrish NO.:  [id number]   MEDICAL RECORD NO.:  [id number]          PATIENT TYPE:  IPS   LOCATION:  4002                         FACILITY:  [facility]   PHYSICIAN:  Ranelle Oyster, M.D.DATE OF BIRTH:  [birth year]   DATE OF ADMISSION:  04/02/2008  DATE OF DISCHARGE:                              HISTORY & PHYSICAL   CHIEF COMPLAINTS:  Weakness and foot pain.   HISTORY OF PRESENT ILLNESS:  This is a 77 year old white male with  diabetes and CAD and chronic gout.  He was admitted on March 22, 2008,  with confusion, slurred speech, and difficulty walking.  MRI was  negative for any focal changes.  He was found to be dehydrated and was  treated also for a UTI.  The patient had improvement of his mental  status.  He was found to have some problems with swallowing and placed  on D-3 thin liquid diet.  He did complain of persistent left foot pain,  more than right foot pain and was treated with Solu-Medrol for a gout  flare, on steroid p.o. taper currently.  The patient had ongoing  problems with balance, as well as processing safety.  Rehab evaluated  the patient and felt he could benefit from an inpatient rehab admission.  He was found to have a few beats of V-tach, and Coreg was adjusted up.   REVIEW OF SYSTEMS:  Notable for weakness, incontinence, shortness of  breath, chronic cough, gout.  Full review is in the written H&P and  other pertinent positives are above.   PAST MEDICAL HISTORY:  1. Positive for diabetes, type 2.  2. CAD.  3. Colon cancer and resection, 2003.  4. GI bleed, 2003.  5. Gout.  6. Glaucoma.  7. Excised cataracts.  8. CHF.   FAMILY HISTORY:  Noncontributory.   SOCIAL HISTORY:  The patient lives in 1-level house with no steps to  enter.  He does smoke 2 cigars a day.  He does not drink.  He is a  retired Soil scientist.   ALLERGIES:  None.   HOME MEDICATIONS:  Actos, glipizide, Coreg, Synthroid, Lanoxin,  Timoptic, and Indocin p.r.n.   LABORATORY DATA:  Hemoglobin 12.1, white count 9.4, platelets 220.  Sodium 137, potassium 4.1, BUN 36, creatinine 1.58.   PHYSICAL EXAMINATION:  VITAL SIGNS:  Blood pressure is 153/67, pulse 60,  respiratory rate 20, temperature 97.5.  GENERAL:  The patient is pleasant, sitting in a bed, arousable,  answering questions, etc.  HEENT:  Pupils are irregular but reactive to light, left more than  right.  Ear, nose, and throat exam notable for missing teeth and a white  coating over the part of the tongue.  Mucosa was fairly pink and moist.  NECK:  Supple without JVD or lymphadenopathy.  CHEST:  Clear to auscultation except for a few crackles at the bases.  HEART:  Notable for systolic murmur.  Otherwise, there was regular rate  and rhythm.  EXTREMITIES:  No clubbing, cyanosis, or edema.  ABDOMEN:  Soft, nontender.  Bowel sounds are positive.  MUSCULOSKELETAL:  Some pain  in the bilateral first MTP joints with mild  redness and warmth.  NEUROLOGIC:  Cranial nerves II-XII showed regular pupils.  He seemed to  have mild right facial asymmetry.  Reflexes are 1+.  Sensation is  grossly intact.  Judgment was fair.  He was oriented to name and place.  Memory was borderline.  Mood was generally pleasant.  Motor exam is  notable for 4-5/5 strength in both upper extremities.  Bilateral lower  extremity strength was 3/5 proximally with a 4/5 distally.   POST ADMISSION PHYSICIAN EVALUATION:  1. Functional deficit secondary to gait disorder, related to UTI,      dehydration, and gout in the setting of delirium.  2. The patient is admitted to receive collaborative interdisciplinary      care between the physiatrist, rehab, nursing staff, and therapy      team.  3. The patient's level of medical complexity and substantial therapy      needs in context of that medical necessity cannot be provided at      lesser intensity of care.  4. The patient has experienced  substantial functional loss from his      baseline.  Upon rehab evaluation on January 14. 2010, the patient      was max to total assist for basic mobility, self-care, bathing, and      dressing.  As of most recent rehab evaluation within the last 24      hours, he is min assist for dressing, min assist for standing with      loss of balance posteriorly, min to mod assist ambulating 70 feet      with rolling walker.  Judging by the patient's diagnosis, physical      exam, and functional history, the patient has potential for      functional progress, which will result in measurable gains while on      inpatient rehab.  These gains will be of substantial and practical      use upon discharge to home where he will receive help from a      granddaughter.  Interim changes in medical status and preadmission      screening are detailed in the history of present illness.  5. Physiatrist will provide 24-hour management of medical needs, as      well as oversight of therapy plan/treatment and provide guidance as      appropriate regarding interaction of the 2.  Medical problem list      is listed below.  6. A 24-hour rehab nursing will assist in management of bowel and      bladder needs, as well as pain management, safety, skin care,      medication administration.  They also help integrate therapy      concept techniques and education.  7. PT will assess and treat for lower body strengthening, gait,      balance, adaptive equipment, pain management, family education.      Goals are supervision.  8. OT will assess and treat for upper extremity use and ADLs, as well      as cognitive/perceptual training, appropriate equipment and family      education.  Goals are supervision to occasional min assist.  9. Speech language pathology will assess and treat for cognitive      deficits.  He does seem to be resolving from cognitive standpoint.      He still needs some remediation here.  Goals are  supervision to  modified independent.  10.Case management/social worker will assess and treat for      psychosocial issues and discharge planning.  11.Team conference will be held weekly to assess the patient's      progress towards goals and determine barriers to discharge.  12.The patient has demonstrated sufficient medical stability and      exercise capacity to tolerate at least 3 hours of therapy per day,      at least 5 days per week.  13.Estimated length of stay is 10 days.  Prognosis is good.   MEDICAL PROBLEM LIST AND PLAN:  1. Acute on chronic renal failure:  This is improved with IV fluid.      We will check admission electrolytes and kidney function panel and      address as appropriate.  We will watch for signs and symptoms of      volume over and underload.  2. Urinary tract infection:  Treated with Rocephin.  We will monitor      for signs and symptoms of urinary dysfunction.  We will culture      urine as appropriate.  3. Questionable mild bronchitis:  Continue nebulizers p.r.n. shortness      of breath.  The patient's antibiotics continue through today, then      are complete.  4. Diabetes, type 2:  The patient is to resume Glucotrol today and      continue with Actos 15 mg p.o. daily.  Hold off on insulin at this      point.  This should improve as steroids taper down.  Cover with      sliding scale insulin as needed.  5. Gout:  This seems to be doing nicely.  Taper steroids and observe      clinically.  6. Deep venous thrombosis prophylaxis:  Subcu Lovenox daily.  7. Heart:  Continue aspirin, Coreg, Lanoxin for now.  Monitor blood      pressure, heart rate, tolerance with activity, etc.      Ranelle Oyster, M.D.  Electronically Signed     ZTS/MEDQ  D:  04/02/2008  T:  04/03/2008  Job:  [id number]

## 2010-07-31 NOTE — Op Note (Signed)
[place]  Patient:    Kyle Parrish, Kyle Parrish                      MRN: [id number] Proc. Date: 08/23/00 Adm. Date:  [id number] Attending:  Ivor Messier CC:         Ronnald Nian, M.D.   Operative Report  PREOPERATIVE DIAGNOSES:  Senile cataract, left eye; chronic open-angle glaucoma, left eye.  POSTOPERATIVE DIAGNOSES:  Senile cataract, left eye, chronic open-angle glaucoma, left eye.  OPERATION:  Planned extracapsular cataract extraction -- phacoemulsification, primary insertion of posterior chamber intraocular lens implant.  SURGEON:  Guadelupe Sabin, M.D.  ASSISTANT:  Nurse.  ANESTHESIA:  Local 4% Xylocaine, 0.75% Marcaine, retrobulbar block, topical tetracaine, intraocular Xylocaine.  Anesthesia standby required in this elderly patient; patient was given sodium pentothal intravenously during the period of retrobulbar block.  SURGEON:  Guadelupe Sabin, M.D.  ASSISTANT:  Nurse.  DESCRIPTION OF PROCEDURE:  After the patient was prepped and draped, a lid speculum was inserted in the left eye.  Schiotz tonometry was recorded at 2 scale units with a 5.5 g weight, indicating slight ocular hypertension.  A peritomy was performed adjacent from the limbus from the 11 to 1 oclock position.  The corneoscleral junction was cleaned and a corneoscleral groove made with a 45 degree Superblade.  The anterior chamber was then entered at the 2 oclock position and the 12 oclock position.  The 2.5-mm diamond keratome was used at the 12 oclock position and a 15 degree blade at the 2 oclock position.  Using a bent 26-gauge needle on a Healon syringe, a circular capsulorrhexis was begun and then completed with the Grabow forceps. Hydrodissection and hydrodelineation were performed using 1% Xylocaine.  The 30-degree phacoemulsification tip was then inserted with slow controlled emulsification of the rather firm lens nucleus with associated  chopping of the lens nucleus against the phacoemulsification probe also fragmented the lens. Following removal of the nucleus with over two minutes of ultrasonic time, the residual cortex was aspirated with the irrigation-aspiration tip.  The posterior capsule appeared intact with a brilliant red fundus reflex.  It was therefore elected to insert an [employer] [id number] silicone posterior chamber intraocular lens implant with UV absorber.  This was inserted with a [employer] forceps into the anterior chamber and then centered into the capsular bag using the [facility] lens rotator.  The lens appeared to be well-centered.  The Healon which was used during the procedure was then aspirated and replaced with balanced salt solution and Miochol ophthalmic solution.  The operative incision appeared to be self-sealing and no sutures were required.  A light patch and protective shield along with Maxitrol ointment completed the operation.  Duration of procedure and anesthesia administration -- 45 minutes.  Patient tolerated the procedure well in general and left the operating room for the recovery room in good condition.DD:  08/23/00 TD:  08/23/00 Job: [id number]

## 2010-07-31 NOTE — Op Note (Signed)
   NAME:  Kyle Parrish, Kyle Parrish NO.:  [id number]   MEDICAL RECORD NO.:  [id number]                   PATIENT TYPE:  AMB   LOCATION:  ENDO                                 FACILITY:  [facility]   PHYSICIAN:  Jaelen C. Madilyn Fireman, M.D.                 DATE OF BIRTH:  1912/05/14   DATE OF PROCEDURE:  12/25/2002  DATE OF DISCHARGE:                                 OPERATIVE REPORT   PROCEDURE:  Colonoscopy with a polypectomy and biopsy.   INDICATIONS FOR PROCEDURE:  Colon cancer resected 1 year ago.   PROCEDURE:  The patient was placed in the left lateral decubitus position  and placed on a pulse monitor with continuous low-flow oxygen delivered via  nasal cannula.  He was sedated with 40 mg IV Fentanyl and 4 mg IV Versed.  Olympus video-colonoscope was inserted into the rectum and advanced to the  ileocolonic anastomosis, which appeared intact.  Along one ridge of the  anastomosis there was some inflamed tissue that was most consistent with  granulation tissue.  Biopsies were taken to rule out neoplasm.  The  remainder of the right colon as well as the transverse, descending, and  sigmoid appeared normal with no further polyps, masses, diverticula, or  mucosal abnormalities.  There was an 8 mm polyp in the rectum that was  removed by snare.  Otherwise, the rectum appeared normal.  The scope was  withdrawn.  The patient was returned to Recovery Room in stable condition.  He tolerated the procedure well and there were no immediate complications.   IMPRESSION:  1. Probable granulation tissue at the anastomosis.  2. Rectal polyp.   PLAN:  Await biopsy results.                                               Zayd C. Madilyn Fireman, M.D.    JCH/MEDQ  D:  12/25/2002  T:  12/25/2002  Job:  [id number]   cc:   Thereasa Solo. Little, M.D.  [address]  [place] [address]  Fax: [phone]   Jimmye Norman III, M.D.  [address], [address]  [place]  [place] [address]  Fax: [phone]   Sharlot Gowda, M.D.  [address] [place], [place] [address]  Fax: [phone]

## 2010-07-31 NOTE — Op Note (Signed)
NAME:  Kyle Parrish, Kyle Parrish NO.:  [id number]   MEDICAL RECORD NO.:  [id number]                   PATIENT TYPE:  INP   LOCATION:  3701                                 FACILITY:  [facility]   PHYSICIAN:  Marta Lamas. Gae Bon, M.D.            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   OPERATION PERFORMED:  Right hemicolectomy.   SURGEON:  Marta Lamas. Lindie Spruce, M.D.   ASSISTANT:  Gabrielle Dare. Janee Morn, M.D.   ANESTHESIA:  General endotracheal.   ESTIMATED BLOOD LOSS:  Less than 75 cc.   COMPLICATIONS:  None.   CONDITION:  Stable.   FINDINGS:  Large cecal mass and possible palpable right transverse colon  mass.  The liver was normal and had no evidence of metastatic disease.  There were no palpable obvious clinical lymph node involvement.   INDICATIONS FOR PROCEDURE:  The patient is an 77 year old gentleman who has  had GI bleeding subsequently found to have a colon cancer in the cecum and  now comes in for right hemicolectomy.   DESCRIPTION OF PROCEDURE:  The patient was taken to the operating room and  placed on the table in the supine position.  After an adequate endotracheal  anesthetic was administered, the patient was prepped and draped in the usual  sterile manner exposing the midline of the abdomen.  A right transverse  incision was made at the level of the umbilicus.  It was taken down to the  anterior rectus sheath on the right side which was subsequently incised  using electrocautery.  Then we cauterized through the rectus abdominis  muscles down to the posterior rectus sheath and then into the peritoneal  cavity while tenting up on the sheath causing no evidence of bowel injury.   Once we were in the peritoneal cavity, we opened the  incision for the  farthest extent medially towards the umbilicus and then laterally towards 2  to 3 cm above the right iliac crest.  We were able to palpate the cecal mass  easily.  Once this was done,  using a Richardson retractor along the right  pericolic area and right lower quadrant, we took the right colon down from  its lateral peritoneal attachments at the white line of Toldt.  We took it  out superiorly towards the hepatic flexure where an attachment was taken  down between [facility] clamps and 2-0 silk ties.  This hepatocolic ligament was  taken down and then subsequently dissected out most of the omentum from the  right transverse colon and that with the stomach however, we did take a  portion or tongue of omentum with the right transverse colon also.   We mobilized the cecum and ascending colon  and the hepatic flexure and right  transverse colon away from its lateral attachments and its peritoneal  attachments.  We also mobilized the terminal ileum and the cecum up into the  wound so that we had free mobility.  Once we had downflow we transected the  mid transverse colon just two the right of the middle colic vessels with a  GIA 75 stapler using 3.5 mm staples.  The distal ileum and terminal ileum  was also taken with the GIA stapler.  We subsequently took the mesentery  between the terminal ileum and the mid transverse colon using Kelly clamps  and 2-0 and 0 silk ties.  The ileocolic vessel and likely the right colic  vessels were taken using 0 silk ties.  Once we had taken the mesentery and  the bowel was removed from the field, we inspected for adequate hemostasis  and there appeared to be no evidence of injury.  We could see the ureter on  the right side with no difficulty and it was not injured.  We subsequently  did a functional end-to-end, side-to-side anastomosis between the terminal  ileum and the mid left transverse colon using a GIA 75 stapler.  The  subsequent  enterotomy was closed using a TA-60 stapler with 3.5 mm staples.  Once this was done, we closed the mesentery using interrupted figure-of-  eight sutures of 3-0 silk.  We then irrigated with saline up to a liter was  used and then we closed.   Prior to closure, we did run the small bowel from the ligament of Treitz all  the way down to the terminal ileum, inspected the left colon, found there to  be no other evidence of any cancers.   Once we had completely explored and removed the tumor, we probed the  transverse incision in two layers.  The posterior rectus sheath was closed  using a running #1 PDS and the anterior sheath closed using running #1 PDS.  The skin was closed using stainless steel staples.  Half inch Marcaine  without epinephrine was injected into the incision.  Sterile dressings were  applied.  Sponge, needle and instrument counts were correct.                                               Marta Lamas. Gae Bon, M.D.    JOW/MEDQ  D:  11/09/2001  T:  11/13/2001  Job:  [id number]

## 2010-07-31 NOTE — Op Note (Signed)
   NAME:  Kyle Parrish, Grajales NO.:  [id number]   MEDICAL RECORD NO.:  [id number]                   PATIENT TYPE:   LOCATION:                                       FACILITY:   PHYSICIAN:  Barrie Folk, M.D.                  DATE OF BIRTH:   DATE OF PROCEDURE:  11/01/2001  DATE OF DISCHARGE:                                 OPERATIVE REPORT   PROCEDURE:  Esophagogastroduodenoscopy.   INDICATIONS FOR PROCEDURE:  Rectal bleeding in a patient with known recently-  healed gastric ulcer who is also on aspirin.   ENDOSCOPIST:  Everardo All. Madilyn Fireman, M.D.   DESCRIPTION OF PROCEDURE:  The patient was placed in the left lateral  decubitus position and placed on a pulse monitor with continuous low-flow  oxygen delivered by nasal cannula.  He was sedated with 20 mg of IV Demerol  and 3 mg of IV Versed.  The Olympus video endoscope was advanced under  direct vision into the oropharynx and esophagus.  The esophagus was straight  and of normal caliber with the squamocolumnar line at 38 cm.  There was no  visible hiatal hernia, ring, stricture, or other abnormality at the GE  junction.  The stomach was entered, and a small amount of liquid secretions  were suctioned were suctioned from the fundus.  Retroflexed view of the  cardia was unremarkable.  The fundus, body, antrum, and pylorus were all  well inspected, and there was some pyloric deformity from previous-healed  ulcer but no stigma of hemorrhage.  There was slight contact friability  after passage of the scope beyond the pylorus and then reinspecting it from  the antrum.  The duodenum was entered and both bulb and second portion were  well inspected and appeared to be within normal limits.  The scope was then  withdrawn and the patient returned to the recovery room in stable condition.  He tolerated the procedure well, and there were no immediate complications.   IMPRESSION:  Pyloric deformity from previous ulcer healing,  no signs of  active upper gastrointestinal bleeding.   PLAN:  Will prep for colonoscopy tomorrow.                                               Barrie Folk, M.D.    JCH/MEDQ  D:  11/01/2001  T:  11/03/2001  Job:  [id number]   cc:   Thereasa Solo. Little, M.D.   Ronnald Nian, M.D.

## 2010-07-31 NOTE — Procedures (Signed)
[place]. [facility]  Patient:    Kyle Parrish, Kyle Parrish Visit Number: [id number] MRN: [id number]          Service Type: MED Location: 2000 2010 01 Attending Physician:  Loreli Dollar Dictated by:   Everardo All Madilyn Fireman, M.D. Proc. Date: 08/15/01 Admit Date:  08/14/2001   CC:         Julieanne Manson, M.D.  Ronnald Nian, M.D.   Procedure Report  PROCEDURE:  Esophagogastroduodenoscopy with esophageal dilatation.  INDICATION FOR PROCEDURE:  Heme-positive stools and anemia.  DESCRIPTION OF PROCEDURE:  The patient was placed in the left lateral decubitus position and placed on the pulse monitor with continuous low-flow oxygen delivered by nasal cannula.  He was sedated with 40 mg IV Demerol and 3 mg IV Versed.  The Olympus video endoscope was advanced under direct vision into the oropharynx and esophagus.  The esophagus was straight and of normal caliber with the squamocolumnar line at 38 cm.  There were two or three erosions in the esophagus consistent with mild erosive esophagitis.  There was no obvious stricture, no esophageal ulcer or obvious hiatal hernia.  The stomach was entered and a small amount of liquid secretions was suctioned from the fundus.  A retroflex view of the cardia was unremarkable.  The fundus, body, and antrum had patchy erythema and granularity consistent with gastritis.  There was a very large, approximately 1.5-2 cm deep, clean-based ulcer just below the angularis of the lesser curvature and a smaller, more shallow 1 cm clean-based ulcer with a small cherry-red spot adjacent to it along the posterior wall of the antrum.  Although these ulcers were not prepyloric, there was some pyloric deformity and a little resistance to passage of the scope into the duodenum and no pyloric channel or duodenal ulcers were seen.  The scope was withdrawn back to the stomach and a CLOtest obtained.  The scope was then withdrawn and the patient returned  to the recovery room in stable condition.  He tolerated the procedure well, and There were no immediate complications.  IMPRESSION:  One large and one moderate gastric ulcer, not actively bleeding at present.  PLAN:  Double-dose proton pump inhibitor with antibiotics and CLOtest positive. Dictated by:   Everardo All Madilyn Fireman, M.D. Attending Physician:  Loreli Dollar DD:  08/15/01 TD:  08/17/01 Job: [id number]

## 2010-07-31 NOTE — Cardiovascular Report (Signed)
[place]. [facility]  Patient:    Kyle Parrish, Kyle Parrish Visit Number: [id number] MRN: [id number]          Service Type: CAT Location: 6500 6531 01 Attending Physician:  Loreli Dollar Dictated by:   Julieanne Manson, M.D. Proc. Date: 09/25/01 Admit Date:  09/25/2001   CC:         Petra Kuba, M.D.  Ronnald Nian, M.D.  Cardiac Catheterization Laboratory   Cardiac Catheterization  INDICATIONS FOR PROCEDURE: The patient is an 77 year old male who was hospitalized about 10 weeks ago with a GI bleed from peptic ulcer disease. At the same setting he had an anterior myocardial infarction and congestive heart failure shortly after this. He was managed medically through the MI, but brought back in as an outpatient for cardiac catheterization. He had endoscopy performed recently that showed healing of the ulcers. He has no recurrent problems with GI bleeding but he does have an elevated creatinine of 1.7.  DESCRIPTION OF PROCEDURE: The patient was prepped and draped in the usual sterile fashion exposing the right groin.  Following local anesthetic with 1% Xylocaine, the Seldinger technique was employed and a 5 [place] introducer sheath was placed in the right femoral artery.  Selective left and right coronary arteriography and hand injection of the left ventricle was performed to minimize the amount of contrast.  Because of the high-grade anatomy noted in his LAD, arrangements for primary stenting were also made. A 6 French sheath and the below mentioned were used for intervention.  RESULTS: 1. Hemodynamic monitoring: Central aortic pressure was 136/60, left    ventricular pressure was 120/46 and there was an 8 mm gradient at the    time of pullback. 2. Ventriculography: Hand injection of the left ventricle revealed the    left ventricle to be dilated. The anterior wall to be markedly hypokinetic,    the apex to be akinetic, ejection fraction around  35%, end-diastolic    pressure markedly elevated at 44.  CORONARY ARTERIOGRAPHY: Dense calcification of the left main and LAD was noted. 1. Left main: Normal. 2. LAD: The LAD had a proximal 95% eccentric area of calcified narrowing.    The midportion had mild irregularities. The LAD crossed the apex of the    heart and gave rise to two small diagonal branches. 3. Circumflex: This was a dominant vessel with three OMs and a PDA and    no significant disease. 4. Right coronary artery: The right coronary artery was a nondominant vessel    with mild irregularities in the midportion.  PRIMARY STENTING: A 6 French introducer sheath was exchanged out for the previously placed 5 [place] sheath. A 6 [place] guide catheter and a short luge wire was used. The wire was placed down to the distal portion of the LAD. A 3.5 x 13 Hepacoat stent was placed in the area of the lesion making sure both the proximal and distal segments were well covered. It was initially deployed at 16 atmospheres for 70 seconds with the final inflation being 16 atmospheres for 60 seconds. The vessel appeared to be normal post stenting, and 95% pre stenting. There was no evidence of any dissection or thrombus.  Because of his GI bleed he was done with heparin only. A heparin-coated stent was also used to reduce potential need for anticoagulants. He was given 300 units of oral Plavix and 40 mg of IV Lasix. He should be ready for discharge in the morning. Total contrast  was 120 cc. BMP is ordered for in the morning. Dictated by:   Julieanne Manson, M.D. Attending Physician:  Loreli Dollar DD:  09/25/01 TD:  09/26/01 Job: [id number]

## 2010-07-31 NOTE — Op Note (Signed)
NAME:  Kyle Parrish, Kyle Parrish NO.:  [id number]   MEDICAL RECORD NO.:  [id number]                   PATIENT TYPE:  INP   LOCATION:  3701                                 FACILITY:  [facility]   PHYSICIAN:  Barrie Folk, M.D.                  DATE OF BIRTH:  1913-02-22   DATE OF PROCEDURE:  11/08/2001  DATE OF DISCHARGE:                                 OPERATIVE REPORT   PROCEDURE PERFORMED:  Colonoscopy with polypectomy   INDICATIONS FOR PROCEDURE:  The patient with known cecal mass presenting  with an acute GI bleed.  He was also found to have two large left sided  polyps that I elected not to remove in the setting of acute bleeding.  He is  scheduled for surgery tomorrow and procedure is to remove polyp and  determine whether he has invasive malignancy presence in either one prior to  planning surgery.   DESCRIPTION OF PROCEDURE:  The patient was placed in the left lateral  decubitus position and placed on the pulse monitor with continuous low flow  oxygen delivered by nasal cannula.  He was sedated with 40 mg IV Demerol and  4 mg IV versed.  The Olympus video colonoscope was inserted into the rectum  and advanced to the cecum which was essentially obscured by a fairly large  cecal mass.  No biopsies were taken.  This did not involve the ileocecal  valve.  The ascending colon appeared normal.  In the proximal transverse  colon there was a small sessile 1.2 cm polyp that was left in place due to  its likely inclusion in the field of the right hemicolectomy planned for  tomorrow.  The remainder of the transverse colon appeared normal.  In the  descending colon, there was a 1.5 cm sessile polyp that removed by snare and  it was felt that this lesion was completely removed.  In the sigmoid colon  at 35 cm there was a 1.5 to 2 cm pedunculated polyp also removed by snare  and 8 mm sessile polyp immediately adjacent to it.  These were both sent in  a separate specimen  container.  The remainder of the sigmoid colon and  rectum appeared normal with no further polyps or masses seen.  The scope was  then withdrawn and the patient returned to the recovery room in stable  condition.  He tolerated the procedure well and there were no immediate  complications.   IMPRESSION:  1. Cecal mass.  2. Descending sigmoid colon polyps.   PLAN:  Will rush pathology on polyps and surgery tentatively planned for  tomorrow.  Barrie Folk, M.D.   JCH/MEDQ  D:  11/08/2001  T:  11/10/2001  Job:  [id number]   cc:   Marta Lamas. Gae Bon, M.D.  Fax: [phone]   Thereasa Solo. Little, M.D.

## 2010-07-31 NOTE — Op Note (Signed)
NAME:  Kyle Parrish, Kyle Parrish NO.:  [id number]   MEDICAL RECORD NO.:  [id number]                   PATIENT TYPE:   LOCATION:                                       FACILITY:   PHYSICIAN:  Barrie Folk, M.D.                  DATE OF BIRTH:   DATE OF PROCEDURE:  DATE OF DISCHARGE:                                 OPERATIVE REPORT   PROCEDURE PERFORMED:  Colonoscopy with biopsy.   ENDOSCOPIST:  Barrie Folk, M.D.   INDICATIONS FOR PROCEDURE:  Presumed lower gastrointestinal bleeding with  esophagogastroduodenoscopy prior to this procedure unrevealing for peptic  ulcer disease.   DESCRIPTION OF PROCEDURE:  The patient was placed in the left lateral  decubitus position and placed on the pulse monitor with continuous low-flow  oxygen delivered by nasal cannula.  He was sedated with 40 mg IV Demerol and  4 mg IV Versed.  The Olympus video colonoscope was inserted into the rectum  and advanced to the cecum, confirmed by visualization of the ileocecal  valve.  There was a large friable cecal mass occupying most of the base of  the cecum and I could not see the mucosa underlying this.  I tried to  intubate the ileocecal valve unsuccessfully.  I saw no blood flowing from  it.  There was a small amount of blood oozing from behind the mass although  the general vicinity of the cecum and ascending colon as well as proximal  transverse colon appeared relatively free of blood compared to other parts  of the colon.  No diverticula were seen in the right colon.  Beginning at  about the mid transverse colon proceeding down to the distal sigmoid colon,  there was a fairly large amount of fairly bright red bloody fluid with some  clots.  Also at 50 cm there was a  1.5 cm sessile polyp which I did not address.  At 35 cm a second  pedunculated polyp approximately 1.5 cm in diameter which I did not remove  either.  A considerable amount of time was spent examining the area of  the  midtransverse colon to the rectosigmoid junction with multiple syringes of  water used to lavage this area to try to clean it up and  assess for any  bleeding sources in the vicinity.  There was still adherent clots but the  clogged suction channel had obscured visualization and I could not be sure  there was not a second bleeding source in this area although none was  specifically seen and I did not see any diverticula anywhere in the colon.  The rectum appeared normal with the exception of one or two small less than  1 cm polyps.  The scope was withdrawn and the patient returned to the  recovery room in stable condition.  The patient tolerated the procedure  well.  There were no immediate  complications.   IMPRESSION:  1. Cecal mass consistent with carcinoma.  2. Two large colon polyps in the left colon.  3. Evidence of recent active bleeding with the most blood concentrated in     the descending and sigmoid colon and I could not exclude a bleeding     source in that area.    PLAN:  Will obtain surgery consult and will possibly need repeat colonoscopy  after bleeding stops to remove the left-sided polyps given that they will  not be within the margin of anticipated surgical  resection assuming he  undergoes a right hemicolectomy.                                                Barrie Folk, M.D.    JCH/MEDQ  D:  11/02/2001  T:  11/06/2001  Job:  [id number]   cc:   Thereasa Solo. Little, M.D.

## 2010-07-31 NOTE — Op Note (Signed)
NAME:  Kyle Parrish, Kyle Parrish NO.:  [id number]   MEDICAL RECORD NO.:  [id number]                   PATIENT TYPE:  OIB   LOCATION:  2867                                 FACILITY:  [facility]   PHYSICIAN:  Guadelupe Sabin, M.D.             DATE OF BIRTH:  May 07, 1912   DATE OF PROCEDURE:  01/22/2003  DATE OF DISCHARGE:                                 OPERATIVE REPORT   PREOPERATIVE DIAGNOSES:  1. Brunescent nuclear cataract, right eye.  2. Chronic open-angle glaucoma, right eye.   POSTOPERATIVE DIAGNOSES:  1. Brunescent nuclear cataract, right eye.  2. Chronic open-angle glaucoma, right eye.   NAME OF OPERATION:  Planned extracapsular cataract extraction-  phacoemulsification, primary insertion of posterior chamber intraocular lens  implant.   SURGEON:  Dr. Cecilie Kicks.   ASSISTANT:  Nurse.   ANESTHESIA:  Local 4% Xylocaine, 0.75% Marcaine, retrobulbar block with  Wydase added, topical tetracaine, intraocular Xylocaine. Anesthesia standby  required. Patient given Ditropan intravenously during the period of  retrobulbar injection.   OPERATIVE PROCEDURE:  After the patient was prepped and draped, a lid  speculum was inserted in the right eye. The eye was turned downward with a  superior rectus traction suture. GS tonometry was recorded at 4 scale units  with a 5.5 gram weight. A peritomy was performed adjacent to the limbus from  the 11 to 1 o'clock position. The corneoscleral junction was cleaned and a  corneoscleral groove made with a 45-degree super blade. The anterior chamber  was then entered with a 2.5-mm diamond keratome at the 12 o'clock position  and the 15-degree blade at the 2:30 position. Using a bent 26-gauge needle  and an OcuCoat syring, a circular capsulorrhexis was begun and completed  with the Graybow forceps. The capsulotomy was somewhat irregular due to the  old iris surgery inferiorly. Hydrodissection and hydrodelineation were  performed  using 1% Xylocaine. The 30-degree phacoemulsification tip was then  inserted with slow-controlled emulsification of the lens nucleus with back  cracking with the Bechert pick. Total ultrasonic time 1 minute 58 seconds.  Average power level 19%. Total amount of fluid used 95 cc. Following removal  of the nucleus, the residual cortex was aspirated with the  irrigation/aspiration tip. The posterior capsule appeared intact with a  brilliant red fundus reflex. It was therefore elected to insert an Allergan  medical optics model SI40NB silicone three piece posterior chamber  intraocular lens implant, diopter strength +18.50. This was inserted with  the McDonald forceps into the anterior chamber and then centered into the  capsular bag using the [facility] lens rotator. The lens appeared to be well  centered. The Provisc which had been used during the latter portio of the  operation and the OcuCoat were removed with the irrigation/aspiration tip.  The operative incision which had been widened was then sutured closed with  four 10-0 interrupted nylon sutures, as there was a tendency  for iris prolapse. Pilopine ophthalmic solution 4% was instilled into the  conjunctival cul-de-sac and Maxitrol ointment. Duration of procedure and  anesthesia administration of 45 minutes. The patient tolerated the procedure  well in general and left the operating room for the recovery room in good  condition.                                               Guadelupe Sabin, M.D.    HNJ/MEDQ  D:  01/22/2003  T:  01/22/2003  Job:  [id number]

## 2010-07-31 NOTE — H&P (Signed)
NAME:  Kyle, Parrish NO.:  [id number]   MEDICAL RECORD NO.:  [id number]                   PATIENT TYPE:  OIB   LOCATION:  2867                                 FACILITY:  [facility]   PHYSICIAN:  Guadelupe Sabin, M.D.             DATE OF BIRTH:  1912-05-16   DATE OF ADMISSION:  01/22/2003  DATE OF DISCHARGE:                                HISTORY & PHYSICAL   REASON FOR ADMISSION:  This was a planned outpatient surgical admission of  this 77 year old white male admitted for cataract implant surgery of the  right eye.   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 Jayme Cloud elsewhere. The patient was first seen in my office on  January 13, 1983 for glaucoma control of his left eye which had been present  for two years controlled on Timoptic 0.25% ophthalmic solution each day by  Dr. Jayme Cloud in [facility]. Examination revealed a corrected acuity of  20/30 right eye, 20/25 left eye, and applanation tonometry of 14 mm right  eye, 20 left eye. The patient was kept on his eye medications and followed  intermittently with visual acuity checks, visual field examinations. The  patient in 2002 was admitted to this hospital on August 23, 2000 for cataract  removal of his left eye. The patient tolerated the procedure well on the  left eye. Postoperatively, the patient did well following the cataract  implant surgery of the left eye with return of vision to 20/30. Recently,  the right eye has been noted to have increasing brunescent nuclear cataract  formation, and the patient is now admitted for cataract implant surgery of  the right eye. The patient has been given oral discussion and printed  information concerning the operative procedure and his complications. He  signed an informed consent, and arrangements were made for his outpatient  admission at this time.   PAST MEDICAL HISTORY:  The patient is under the care of Dr. Sharlot Gowda.  His cardiologist is Dr. Caprice Kluver. The patient has had 2003 stent insertion.  The patient currently takes aspirin but has discontinued this for the past  week in preparation for the perioperative period.   REVIEW OF SYSTEMS:  No cardiorespiratory complaints.   PHYSICAL EXAMINATION:  VITAL SIGNS:  As recorded on admission, blood  pressure 135/59, temperature 97, pulse 51, respirations 16.  GENERAL APPEARANCE:  The patient is a pleasant, well-developed, well-  nourished, white male in no acute distress.  HEENT:  Eyes:  Visual acuity 20/50 +/- right eye. External ocular and slit-  lamp examination:  The eyes are wide and clear. The anterior chamber of the  right eye is slightly shallow. A brunescent nuclear cataract is present. Old  sector iridectomy from the iris surgery is noted from the 4 to  6 o'clock  position. Detailed fundus examination, dilated, reveals a clear vitreous  attached retina. The optic nerve is sharply outlined, of good color, with a  disk/cup ratio of 0.4 in each eye. No deep glaucomatous cupping is present.  CHEST:  Lungs clear to percussion and auscultation.  HEART:  Normal sinus rhythm. No cardiomegaly. No murmurs.  ABDOMEN:  Negative.  EXTREMITIES:  Negative.    ADMISSION DIAGNOSES:  1. Senile brunescent nuclear cataract, right eye.  2. Chronic open-angle glaucoma, both eyes.   SURGICAL PLAN:  Cataract implant surgery, right eye.                                                Guadelupe Sabin, M.D.    HNJ/MEDQ  D:  01/22/2003  T:  01/22/2003  Job:  [id number]   cc:   Sharlot Gowda, M.D.  [address] [place], [place] [address]  Fax: [phone]

## 2010-07-31 NOTE — Procedures (Signed)
[place]. [facility]  Patient:    Kyle, Parrish Visit Number: [id number] MRN: [id number]          Service Type: END Location: ENDO Attending Physician:  Louie Bun Dictated by:   Everardo All Madilyn Fireman, M.D. Proc. Date: 09/19/01 Admit Date:  09/19/2001 Discharge Date: 09/19/2001   CC:         Gaspar Garbe B. Little, M.D.   Procedure Report  PROCEDURE PERFORMED:  Esophagogastroduodenoscopy with biopsies.  ENDOSCOPIST:  Everardo All. Madilyn Fireman, M.D.  INDICATIONS FOR PROCEDURE:  Gastrointestinal bleeding from two large distal gastric ulcers approximately two months ago.  Procedure is to assess for healing and to rule out malignancy if pertinent after two months of antipeptic therapy.  DESCRIPTION OF PROCEDURE:  The patient was placed in the left lateral decubitus position and placed on the pulse monitor with continuous low flow oxygen delivered by nasal cannula.  He was sedated with 30 mg IV Demerol and 4 mg of IV Versed.  The Olympus video endoscope was advanced under direct vision into the oropharynx and esophagus.  The esophagus was straight and of normal caliber at the squamocolumnar line at 38 cm.  There was no visible hiatal hernia, ring, stricture or other abnormality at the gastroesophageal junction.  The stomach was entered and a small amount of liquid secretions were suctioned from the fundus.  Retroflex view of the cardia was unremarkable.  The fundus appeared normal.  The body and antrum showed diffuse erythema and granularity consistent with a gastritis.  In addition, the pylorus was deformed and somewhat swollen and there was some retained food in the stomach consistent with some delayed gastric emptying for the pyloric deformity.  I could not see any definitive ulcer although when the scope was pushed through the pylorus, visualization was not good.  However, beyond the pylorus, the duodenal bulb and second portion appeared normal.  The scope  was withdrawn back to the stomach.  There was no visible suspicion of any tumor and again no ulcer seen although I could not rule out a small ulcer within the pylorus.  CLO test was obtained.  The scope was then withdrawn and the patient returned to the recovery room in stable condition.  The patient tolerated the procedure well and there were no immediate complications.  IMPRESSION: 1. Deformed pylorus from healing of previous ulcers. 2. Marked gastritis. 3. Cannot rule out small persistent pyloric channel ulcer. 4. Retained food in the stomach suggesting delayed gastric emptying    from pyloric edema/stenosis.  PLAN:  Will continue proton pump inhibitor and await CLO test which had been negative on previous study.  Will watch for signs of early satiety, nausea, vomiting or other signs of partial outlet obstruction. Dictated by:   Everardo All Madilyn Fireman, M.D. Attending Physician:  Louie Bun DD:  09/19/01 TD:  09/21/01 Job: [id number]

## 2010-07-31 NOTE — Discharge Summary (Signed)
[place]. [facility]  Patient:    Kyle Parrish, Kyle Parrish Visit Number: [id number] MRN: [id number]          Service Type: MED Location: [phone] Attending Physician:  Loreli Dollar Dictated by:   Adrian Saran, N.P. Admit Date:  08/21/2001 Discharge Date: 08/30/2001                             Discharge Summary  No dictation Dictated by:   Adrian Saran, N.P. Attending Physician:  Loreli Dollar DD:  09/05/01 TD:  09/06/01 Job: [id number]

## 2010-07-31 NOTE — Discharge Summary (Signed)
NAME:  Kyle Parrish, Kyle Parrish NO.:  [id number]   MEDICAL RECORD NO.:  [id number]                   PATIENT TYPE:  INP   LOCATION:  3701                                 FACILITY:  [facility]   PHYSICIAN:  Marta Lamas. Gae Bon, M.D.            DATE OF BIRTH:  [birth year]   DATE OF ADMISSION:  11/01/2001  DATE OF DISCHARGE:  11/15/2001                                 DISCHARGE SUMMARY   DISCHARGE DIAGNOSIS:  Cecal adenocarcinoma.   ADDITIONAL DIAGNOSES:  1. Blood loss anemia.  2. Coronary artery disease.  3. Congestive heart failure.  4. Non-insulin-dependent diabetes.   DIET:  His diet on discharge is an ADA, low-salt diet.   CONDITION ON DISCHARGE:  His condition is stable.   DISCHARGE MEDICATIONS:  1. Coreg 6.5 mg p.o. b.i.d.  2. Demadex 20 mg p.o. q.d.  3. Digoxin 0.125 mg p.o. q.d.  4. Ferrous sulfate 325 mg p.o. t.i.d.  5. He will have Percocet tablets one to two every four hours as needed for     pain.  6. Protonix 40 mg p.o. q.d.  7. Multivitamin one tablet p.o. q.d.  8. Potassium chloride 40 mEq p.o. q.d.  9. Zofran p.r.n.  10.      Plavix.  11.      Aspirin.   WOUND CARE:  His wound is healing well.  He has no specific wound care.  He  can shower and pat his wound dry but should not take a bath yet.   FOLLOWUP:  He is to follow up to see me in my office in two weeks on the  16th of September 2003.   BRIEF SUMMARY OF HOSPITAL COURSE:  The patient was admitted by Dr. Barrie Folk on November 01, 2001, having had a history of coronary artery disease,  congestive heart failure and gastric ulcers, presenting in June with GI  bleeding.  He was started on Protonix after an EGD in July which showed some  healing of the ulcers at that time.  He had a cardiac stent placed by Dr.  Gaspar Garbe B. Little subsequent to that and he continued to be anemic.  Because  of this, he is being admitted for painless hematochezia associated with  chest pain and abdominal  pain but no vomiting.  His BUN and creatinine were  slightly elevated.  The patient underwent an upper GI endoscopy again along  with a subsequent colonoscopy, which were performed on November 01, 2001,  which demonstrated the patient to have two descending colon lesions and a  right cecal mass which on biopsy demonstrated an adenocarcinoma; a surgical  consultation was obtained.   I saw the patient on November 02, 2001, at which time there was still some  concern about his bleeding time because he had been on Plavix and aspirin,  and also these two left colon lesions/polyps unrelated to the adenocarcinoma  in the cecum.  He underwent a bowel prep over the weekend and underwent a  second colonoscopy on the 27th, removing the two polyps on the left side,  both of which were benign.  He was subsequently scheduled for surgery, which  was done on the 28th, at which time he underwent a right hemicolectomy  through a right transverse abdominal incision.   Postoperatively, the patient did well, was started on clear liquids  initially and by postop day #5, he was on a soft diet and was discharged  home on postop day #6, doing well, no further bleeding.  His hemoglobin had  dropped down to about 7.5 and he was given one unit of blood because of his  cardiac history; he subsequently went up to a hemoglobin of 9.5 with a  hematocrit of about 29% and that is where it stand.  He was subsequently  placed on iron and vitamins.  He will be discharged home on iron and  vitamins and including, in addition to the Percocet, he has been asked to  start his Plavix and his aspirin tomorrow, which will be postop day #7.  He  is to follow up to see me in two weeks.  I did remove his staples and  applied Steri-Strips prior to discharge.                                                Marta Lamas. Gae Bon, M.D.    JOW/[id number]  D:  11/15/2001  T:  11/16/2001  Job:  [id number]

## 2010-07-31 NOTE — Discharge Summary (Signed)
[place]. [facility]  Patient:    Kyle Parrish, Kyle Parrish Visit Number: [id number] MRN: [id number]          Service Type: MED Location: [phone] Attending Physician:  Loreli Dollar Dictated by:   Adrian Saran, N.P. Admit Date:  08/21/2001 Discharge Date: 08/30/2001               Donn C. Madilyn Fireman, M.D.  Ronnald Nian, M.D.   Discharge Summary  DISCHARGE DIAGNOSES: 1. Anemia secondary to gastrointestinal bleed. 2. Coronary artery disease, status post recent myocardial infarction. 3. Diabetes. 4. Urinary tract infection.  PROCEDURES PERFORMED: None.  COMPLICATIONS: None.  DISCHARGE STATUS: Stable.  ADMISSION HISTORY:  This is an 77 year old male who has had an approximately 10 day history of coffeeground emesis and black tarry stools. He states on the day prior to admission he became very weak and developing shortness of breath. During the day his shortness of breath developed into fairly moderate DOE to the point where he was unable to walk across the room secondary to DOE.  On the day of admission his shortness of breath and DOE became much more severe. He denies ever having any chest pain. He has had no awareness of tachycardia or arrhythmias. He denies any near syncope.  He presented to the [facility] ER on August 14, 2001, for further evaluation. He was noted to have a hemoglobin of 6.8. He was moderately hypotensive with a systolic blood pressure in the 70s. His stools were heme positive. He received 2 units of packed red blood cells as well as a 500 cc bolus of IV saline in the ER. His blood pressure improved to 110 systolic. Cardiac enzymes at [facility] revealed a troponin of 4.9. He also had noted ST depression in the anterior lateral leads (V4 through V6), which were new. The patient was transferred to [facility] for further treatment and evaluation.  PHYSICAL EXAMINATION ON ADMISSION:  GENERAL:  He  was conscious and alert and well oriented.  VITAL SIGNS:  Blood pressure 120/72, heart rate 81, temperature 98.6, oxygen saturations 96% on 2 liters.  SKIN:  Warm and dry, pale.  NECK:  No JVD, thyromegaly or cervical lymphadenopathy.  LUNGS:  With bibasilar crackles.  HEART:  Regular rate and rhythm, a 1/6 systolic murmur.  ABDOMEN: Soft, nondistended, normal bowel sounds. No hepatosplenomegaly. Mild epigastric tenderness with palpation.  EXTREMITIES:  Lower extremities with +1 edema noted bilaterally with the right being more prominent than the left. Pulses were +2 bilaterally.  LABORATORY DATA:  Initial labs at [facility] showed white count 12,000, hemoglobin 8.5, hematocrit 24.4, platelets 259. CMP showed a sodium of 134, glucose 153, BUN 51, creatinine 1.7. Total protein and albumin were both low at 5.8 and 2.7. Liver function tests were elevated with an AST of 240, ALT 346. Cardiac enzymes showed a total CK of 289 with 26.9 MBs and a troponin of 14.6.  Chest x-ray showed mild interstitial edema.  HOSPITAL COURSE:  The patient was admitted to cycle cardiac enzymes as well as to treat GI bleed. A GI consult was requested and endoscopy was planned for the next morning. He would need a cardiac catheterization and probable intervention, however, the GI bleed is priority at this point.  Vital signs stabilized on day two of admission with a blood pressure of 114/48. He remained afebrile and oxygen saturations remained in the upper 90s. An abdominal ultrasound was ordered secondary to elevated  LFTs. A 2D echocardiogram was also ordered to evaluate heart function.  Results from the EGD on August 15, 2001, showed moderate to large antral ulcers as well as some mild esophagitis and gastritis. The results of the ultrasound were normal. He was continued on twice a day PPIs. His aspirin was continued to be placed on hold. His hemoglobin was 8.4, and he was given one more  unit of PRBCs.  On day two of admission he was feeling much better. He had no more shortness of breath and was becoming anxious to go home. He had a normal formed bowel movement, normal in color. Vital signs remained stable. His hemoglobin post transfusion was 9.4 with a hematocrit of 27.7. His white count, however, had increased to 15,000. Urine output was good. Lungs had essentially cleared with the exception of occasional wheeze. He was resumed on a low dose beta blocker, however, ACE inhibitor was continued to be held secondary to upper limits of normal BUN and creatinine.  A 2D echocardiogram was performed on August 16, 2001, which showed decreased LV function with an EF noted of 40%. There was also severe hypokinesis of the anterior wall and apical wall.  The patient was discharged to home on August 17, 2001, in stable condition. Blood pressure had stabilized. He remained in normal sinus rhythm. He remained to be afebrile. Urinalysis was checked prior to discharge and was shown to be positive for urinary tract infection. He was started on Septra DS, which would be continued as an outpatient. With him being status post anterior MI, he will need a cardiac catheterization, however, this will need to be delayed for approximately three weeks or so to get the site of GI bleeding a chance to heal well. Hopefully we will be able to restart his ACE inhibitor as an outpatient as long as his blood pressure can tolerate it.  His hemoglobin at discharge was 10.1 with a hematocrit of 30.3. His BUN and creatinine at discharge were 43 and 1.7. His weight at discharge was 185 pounds.  DISCHARGE MEDICATIONS: 1. Protonix 40 mg b.i.d. 2. Glucotrol XL 5 mg q.d. 3. Actos 50 mg q.d. 4. Toprol XL 25 mg q.d. 5. Nitroglycerin 0.4 mg sublingual p.r.n. 6. Timoptic 0.5% b.i.d. 7. Septra DS 1 q.d. x 7 days. 8. Colace 1 q.d. per recommendation of Dr. Madilyn Fireman.   DISCHARGE INSTRUCTIONS:  He will remain limited  as far as activity for the next two weeks. He is not to drive or undergo any strenuous activity. He is to maintain a low salt, low fat, low cholesterol diet as well as a diabetic diet restrictions.  FOLLOWUP:  He is to follow up with Dr. Clarene Duke in approximately 10 days. He is to call for an appointment. He is to see Dr. Madilyn Fireman for follow up in three weeks. He is to call for that appointment.   Dictated by:   Adrian Saran, N.P.  Attending Physician:  Loreli Dollar DD:  09/05/01 TD:  09/06/01 Job: [id number]

## 2010-07-31 NOTE — Discharge Summary (Signed)
[place]. [facility]  Patient:    Kyle Parrish, Kyle Parrish Visit Number: [id number] MRN: [id number]          Service Type: CAT Location: [phone] Attending Physician:  Loreli Dollar Dictated by:   Adrian Saran, N.P. Admit Date:  09/25/2001 Discharge Date: 09/26/2001                             Discharge Summary  ADMITTING DIAGNOSES: 1.  Congestive heart failure. 2.  Ischemic cardiomyopathy. 3.  Recent gastrointestinal bleeding. 4.  Non-insulin-dependent diabetes.  DISCHARGE DIAGNOSES: 1.  Congestive heart failure, improved. 2.  Ischemic cardiomyopathy with an ejection fraction of 35-40%. 3.  Coronary artery disease with recent myocardial infarction. 4.  Non-insulin-dependent diabetes. 5.  Chronic renal insufficiency.  ADMISSION HISTORY:  This is a 77 year old male who was discharged from [facility] on August 17, 2001, secondary to GI bleed from gastric ulcer/AVM and profound anemia.  His hemoglobin on admission at that time was 6.8.  He as a result had elevated cardiac enzymes that were consistent with an MI.  He denied having any chest pain.  He did have significant generalized weakness and profound shortness of breath.  Two days prior to this admission, he had noticeable increase in his dyspnea on exertion and was having fairly moderate orthopnea, again had no chest pain. He had no noticeable bleeding, i.e., melena or hematuria.  He was adamant that he had not ingested large amount of sodium in his diet.  He began taking 40 mg of Lasix which he has done for 2 days; however, had no improvement in his symptoms.  He developed profound generalized weakness to the point that he was unable to ambulate and continued with severe orthopnea.  He had fairly moderate peripheral edema for at least 2-3 days prior to this admission.  He presents to the emergency room for further treatment and evaluation.  PHYSICAL EXAMINATION ON ADMISSION:  VITAL SIGNS:   Blood pressure 110/70, heart rate 76, respirations 16, he was afebrile, and O2 saturation was 96% on room air.  GENERAL:  He was constant, alert, and well oriented and appeared in moderate amount of distress.  SKIN:  Warm and dry with normal color and temperature.  LUNGS:  Decreased breath sounds at the bases bilaterally.  He had rales as well as expiratory wheezes in all fields bilaterally.  HEART:  Regular rate and rhythm with 1/6 systolic murmur.  He also had a notable S4.  EXTREMITIES:  Lower extremities had 2+ dense edema noted bilaterally up to the level of the knees.  ABDOMEN:  Soft and nondistended.  Normal bowel sounds in all quadrants.  No hepatosplenomegaly.  He did have some mild epigastric tenderness with palpation.  LABORATORY DATA ON ADMISSION:  Hemoglobin of 9.8, hematocrit of 29.4, and white count of 15,000.  CMP showed mild hyponatremia of 128.  BUN and creatinine were 58 and 2.4, and glucose was elevated at 170.  SGPT was also elevated at 81, and he was slightly hypocalcemic at 8.3.  Cardiac enzymes showed a total CK of 61 with 3.8 MBs and a troponin of 1.67.  Chest x-ray showed CHF with a small pleural effusion.  EKG showed anterior MI with lateral ischemic changes; however, there were least changes that were not acute since his last tracing on June 2003.  HOSPITAL COURSE:  The patient was readmitted for aggressive IV diuretics and treatment of his CHF.  At some point, he will have a diagnostic cardiac catheterization; however, he will need to be relatively supine in order to do this.  GI bleeds had finally been resolved, and we were giving him some time to get adequate rest before proceeding with cardiac catheterization; however, he presented today in failure.  We monitored his diabetes.  He will be placed on IV Lasix and started on IV dobutamine.  On the day of admission, he was feeling much better and has much less shortness of breath.  He continued to  have decreased breath sounds in the bases and rales, however, were not as pronounced.  Peripheral edema had lessened somewhat.  His weight was down only approximately 2 pounds.  He was started on Coreg 3.125 mg b.i.d.  No ACE or ARB was given secondary to renal insufficiency.  Cardiac catheterization again will have to be post planned for about 2 weeks to ensure complete resolution of these symptoms.  Repeat CBC showed stable hemoglobin.  He continued to progress well during this admission.  His shortness of breath and orthopnea had dramatically improved. O2 saturations remained good.  Blood pressure was stable on Coreg.  He continued to have good urine output and good diuresis.  His Lasix was changed to p.o. on third day of admission and IV dobutamine was weaning.  His diabetes remained controlled, and his renal insufficiency remained stable.  He did begin complaining of some dysuria, and UA and C&S were ordered.  It was positive for UTI, and he was started on Cipro on August 25, 2001.  He had a Foley catheter in place at his request secondary to aggressive IV diuresis. Foley catheter was discontinued on August 26, 2001.  He continued to make good progress.  IV dobutamine was discontinued on August 26, 2001.  He continued to have a moderate amount of peripheral edema, although clinically his CHF was dramatically improved.  He was given intermittent doses of IV Lasix times two.  His Coreg was finally able to be increased to 6.25 mg b.i.d. on August 28, 2001.  Prior to that, he has had relatively low blood pressure and not tolerated it.  He also had an isolated episode of SVT with a heart rate increasing to approximately 150.  He was asymptomatic with this.  He continued to do well on last 2 days of admission.  He was up and ambulating without difficulty.  Zaroxolyn 2.5 mg was added to his regimen every other day in addition to p.o. Lasix.  He was completely off dobutamine and tolerated increase  in Coreg dosage.  Renal function remained stable.  On August 30, 2001, the patient was discharged home without any further episode.   DISCHARGE MEDICATIONS: 1.  Demadex 40 mg two b.i.d. 2.  Digoxin 0.125 mg q.d. 3.  Coreg 3.125 mg b.i.d. 4.  Protonix 40 mg b.i.d. 5.  Glucotrol XL 5 mg q.d. 6.  Actos 15 mg q.d. 7.  Cipro 250 mg q.8h. times five days. 8.  Timoptic 0.5% ophthalmic solution one drop, OU b.i.d.  ACTIVITIES:  He is to undergo no strenuous activity until he is to see Dr. Clarene Duke for followup.  DIET:  He is to maintain a low-salt diet as well as diabetic diet restrictions.  He is to limit fluid intake to 1.5 liters q.d.  He will need a repeat BMP in one week.  FOLLOWUP:  He is to see Dr. Clarene Duke for followup one week after discharge.  He is to call for an  appointment. Dictated by:   Adrian Saran, N.P. Attending Physician:  Loreli Dollar DD:  10/06/01 TD:  10/10/01 Job: [id number]

## 2010-07-31 NOTE — H&P (Signed)
[place]. [facility]  Patient:    Kyle Parrish, Kyle Parrish                      MRN: [id number] Adm. Date:  [id number] Attending:  Ivor Messier CC:         Ronnald Nian, M.D.   History and Physical  REASON FOR ADMISSION:  This was the planned outpatient surgical admission of this 76 year old white male admitted for cataract/implant surgery of the left eye.  PRESENT ILLNESS:  This patient has been followed in my office since January 13, 1983.  At that time, the patient had a past history of open angle glaucoma treated with Timoptic ophthalmic solution.  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 Initial examination in my office revealed a visual acuity of 20/30, right eye, 20/25, left eye, with correction.  Slitlamp examination confirmed the presence of an old large inferior iridectomy, the site of the previous tumor location. Over the ensuing years, the patient has been followed for his chronic glaucoma and has been well-controlled.  Recently, cataract formation has developed in both eyes, reducing his vision to best correction of 20/60, right eye, 20/70, left eye.  The nuclear cataracts were becoming increasingly brunescent, indicating color and possible texture change.  It was elected to proceed with surgery of the left eye now, right eye later.  Patient signed an informed consent and arrangements were made for his outpatient admission.  PAST MEDICAL HISTORY:  Patient is under the care of Dr. Ronnald Nian.  The patient, on his preop evaluation, was found to have an elevated blood sugar at 348.  The patient was seen by Dr. Susann Givens and his associates and begun on blood sugar control, currently Actos and Glucotrol; blood sugar has accordingly decreased and the patient has been seen by a nutritionist in hopes of reeducating him in his diabetic diet.  REVIEW OF SYSTEMS:  Patient, however,  states that he is in good general health and has no specific complaints.  PHYSICAL EXAMINATION:  VITAL SIGNS:  As recorded on admission:  Blood pressure 156/74, temperature 97.1, pulse 67, respirations 18.  GENERAL APPEARANCE:  Patient is a pleasant, well-nourished, well-developed white male in no acute distress.  HEENT:  Patient has facial acne rosacea.  The eyes are white and clear, however, with clear corneae, deep and clear anterior chambers and nuclear cataract formations.  Detailed fundus examination last revealed a clear vitreous, attached retinae with minimal glaucomatous cupping.  The blood vessels and maculae appeared normal.  CHEST:  Lungs clear to percussion and auscultation.  HEART:  Normal sinus rhythm.  No cardiomegaly.  No murmurs.  ABDOMEN:  Negative.  EXTREMITIES:  Negative.  ADMISSION DIAGNOSIS: 1. Senile cataracts, left and right eyes. 2. History of iris tumor removal, right eye. 3. Chronic open-angle glaucoma, both eyes.  SURGICAL PLAN:  Cataract/implant surgery, left eye now, right eye later as needed. DD:  08/23/00 TD:  08/23/00 Job: [id number]

## 2010-07-31 NOTE — H&P (Signed)
NAME:  Kyle Parrish, BRINDLE NO.:  [id number]   MEDICAL RECORD NO.:  [id number]                   PATIENT TYPE:  INP   LOCATION:  3701                                 FACILITY:  [facility]   PHYSICIAN:  Barrie Folk, M.D.                  DATE OF BIRTH:  1912/06/26   DATE OF ADMISSION:  11/01/2001  DATE OF DISCHARGE:                                HISTORY & PHYSICAL   CHIEF COMPLAINT:  Rectal bleeding.   HISTORY OF PRESENT ILLNESS:  The patient is a 77 year old, white male with  history of coronary artery disease and congestive heart failure who was  found to have two gastric ulcers when he presented with a GI bleed in June.  He was treated with Protonix and had to follow up EGD on September 19, 2001, which  showed healing of the ulcers and a negative CLOtest.  Since that time, he  has had chest pain and Dr. Mindi Junker Little placed a coronary cardiac stent on September 25, 2001, and he was started on aspirin within the last week or so.  His  last hemoglobin that I could find was 9.5.  He did well this morning until  he began to have painless hematochezia without any melena, chest pain,  abdominal pain, and no vomiting.  His BUN was 31 and creatinine 1.2 with a  hemoglobin of 6.7.  He was hemodynamically stable.   PAST MEDICAL HISTORY:  1. Coronary artery disease.  2. Congestive heart failure.  3. Diabetes, non-Insulin dependent.   ALLERGIES:  NONE KNOWN.   MEDICATIONS:  Demadex, Lanoxin, Actos, Protonix, Glucotrol, and one Aspirin  a day.  He also may be on Coreg.   SURGERIES:  Not known at present from available records and history.   SOCIAL HISTORY:  The patient smokes 2 cigars a day and denies alcohol.   PHYSICAL EXAMINATION:  GENERAL:  A pale, white male in no acute distress.  VITAL SIGNS:  Blood pressure 114/48, heart rate 80, respirations 18.  He is  afebrile.  HEENT:  Unremarkable except for pallor.  LUNGS:  Clear.  HEART:  Regular rate and rhythm without  murmur.  ABDOMEN:  Soft, nondistended with normoactive bowel sounds.  No  hepatosplenomegaly, mass, or guarding.   IMPRESSION:  Acute gastrointestinal bleed, unclear whether upper or lower.   PLAN:  We will pursue a EGD today.  We will transfuse 2 U of packed red  blood cells and if no evidence of upper GI bleed we will need a colonoscopy.  We will discuss anticoagulation with Dr. Clarene Duke.  Barrie Folk, M.D.    [facility]MED[facility]  D:  11/01/2001  T:  11/03/2001  Job:  [id number]   cc:   Thereasa Solo. Little, M.D.   Ronnald Nian, M.D.

## 2010-08-03 ENCOUNTER — Ambulatory Visit (INDEPENDENT_AMBULATORY_CARE_PROVIDER_SITE_OTHER): Payer: Medicare Other | Admitting: Family Medicine

## 2010-08-03 ENCOUNTER — Encounter: Payer: Self-pay | Admitting: Family Medicine

## 2010-08-03 DIAGNOSIS — I10 Essential (primary) hypertension: Secondary | ICD-10-CM

## 2010-08-03 DIAGNOSIS — E1169 Type 2 diabetes mellitus with other specified complication: Secondary | ICD-10-CM

## 2010-08-03 DIAGNOSIS — I152 Hypertension secondary to endocrine disorders: Secondary | ICD-10-CM | POA: Insufficient documentation

## 2010-08-03 DIAGNOSIS — E1159 Type 2 diabetes mellitus with other circulatory complications: Secondary | ICD-10-CM

## 2010-08-03 DIAGNOSIS — R7989 Other specified abnormal findings of blood chemistry: Secondary | ICD-10-CM

## 2010-08-03 DIAGNOSIS — E039 Hypothyroidism, unspecified: Secondary | ICD-10-CM | POA: Insufficient documentation

## 2010-08-03 DIAGNOSIS — I2510 Atherosclerotic heart disease of native coronary artery without angina pectoris: Secondary | ICD-10-CM

## 2010-08-03 DIAGNOSIS — E118 Type 2 diabetes mellitus with unspecified complications: Secondary | ICD-10-CM | POA: Insufficient documentation

## 2010-08-03 DIAGNOSIS — E119 Type 2 diabetes mellitus without complications: Secondary | ICD-10-CM

## 2010-08-03 DIAGNOSIS — Z85038 Personal history of other malignant neoplasm of large intestine: Secondary | ICD-10-CM

## 2010-08-03 LAB — BASIC METABOLIC PANEL
Calcium: 9.2 mg/dL (ref 8.4–10.5)
Glucose, Bld: 246 mg/dL — ABNORMAL HIGH (ref 70–99)
Potassium: 4.6 mEq/L (ref 3.5–5.3)
Sodium: 134 mEq/L — ABNORMAL LOW (ref 135–145)

## 2010-08-03 LAB — POCT GLYCOSYLATED HEMOGLOBIN (HGB A1C): Hemoglobin A1C: 7.2

## 2010-08-03 NOTE — Patient Instructions (Signed)
Continue on all your medications. Return here in 4 months.

## 2010-08-03 NOTE — Progress Notes (Signed)
  Subjective:    Patient ID: Kyle Parrish, male    DOB: 1913/02/24, 77 y.o.   MRN: [id number]  HPI for a diabetes recheck. He does check his blood sugars every other day. He saw his eye doctor earlier this year. He continues on medications listed in the chart. He occasionally will smoke a cigar. Does not drink. Exercises very minimal. He does not wear his hearing aid like he should and I strongly encouraged him to use it checks his feet fairly regularly and they're doing fine. He has no other concerns or complaints. He is to see his cardiologist in the near future.   Review of Systems     Objective:   Physical Exam alert and in no distress. Hemoglobin A1c is recorded.       Assessment & Plan:  Diabetes. ASHD. Dyslipidemia. Hypothyroid. Continue on present medication regimen. Followup here in 4 months.

## 2010-08-04 ENCOUNTER — Telehealth: Payer: Self-pay

## 2010-08-04 NOTE — Telephone Encounter (Signed)
Pt informed of labs to drink extra fluids and recheck in 

## 2010-08-28 ENCOUNTER — Other Ambulatory Visit: Payer: Self-pay | Admitting: Family Medicine

## 2010-12-02 ENCOUNTER — Encounter: Payer: Self-pay | Admitting: Family Medicine

## 2010-12-02 ENCOUNTER — Ambulatory Visit (INDEPENDENT_AMBULATORY_CARE_PROVIDER_SITE_OTHER): Payer: Medicare Other | Admitting: Family Medicine

## 2010-12-02 DIAGNOSIS — N058 Unspecified nephritic syndrome with other morphologic changes: Secondary | ICD-10-CM

## 2010-12-02 DIAGNOSIS — E1129 Type 2 diabetes mellitus with other diabetic kidney complication: Secondary | ICD-10-CM

## 2010-12-02 DIAGNOSIS — R634 Abnormal weight loss: Secondary | ICD-10-CM

## 2010-12-02 DIAGNOSIS — Z23 Encounter for immunization: Secondary | ICD-10-CM

## 2010-12-02 DIAGNOSIS — E119 Type 2 diabetes mellitus without complications: Secondary | ICD-10-CM

## 2010-12-02 DIAGNOSIS — Z79899 Other long term (current) drug therapy: Secondary | ICD-10-CM

## 2010-12-02 DIAGNOSIS — Z85038 Personal history of other malignant neoplasm of large intestine: Secondary | ICD-10-CM

## 2010-12-02 DIAGNOSIS — D649 Anemia, unspecified: Secondary | ICD-10-CM

## 2010-12-02 DIAGNOSIS — E785 Hyperlipidemia, unspecified: Secondary | ICD-10-CM

## 2010-12-02 DIAGNOSIS — I2510 Atherosclerotic heart disease of native coronary artery without angina pectoris: Secondary | ICD-10-CM

## 2010-12-02 DIAGNOSIS — E039 Hypothyroidism, unspecified: Secondary | ICD-10-CM

## 2010-12-02 DIAGNOSIS — E1121 Type 2 diabetes mellitus with diabetic nephropathy: Secondary | ICD-10-CM

## 2010-12-02 LAB — COMPREHENSIVE METABOLIC PANEL
Albumin: 3.9 g/dL (ref 3.5–5.2)
Alkaline Phosphatase: 52 U/L (ref 39–117)
BUN: 26 mg/dL — ABNORMAL HIGH (ref 6–23)
CO2: 22 mEq/L (ref 19–32)
Calcium: 9.1 mg/dL (ref 8.4–10.5)
Chloride: 102 mEq/L (ref 96–112)
Glucose, Bld: 139 mg/dL — ABNORMAL HIGH (ref 70–99)
Potassium: 4.5 mEq/L (ref 3.5–5.3)
Sodium: 139 mEq/L (ref 135–145)
Total Protein: 6.1 g/dL (ref 6.0–8.3)

## 2010-12-02 LAB — CBC WITH DIFFERENTIAL/PLATELET
HCT: 32 % — ABNORMAL LOW (ref 39.0–52.0)
Hemoglobin: 10.6 g/dL — ABNORMAL LOW (ref 13.0–17.0)
Lymphocytes Relative: 25 % (ref 12–46)
Lymphs Abs: 2.5 10*3/uL (ref 0.7–4.0)
MCHC: 33.1 g/dL (ref 30.0–36.0)
Monocytes Absolute: 0.8 10*3/uL (ref 0.1–1.0)
Monocytes Relative: 7 % (ref 3–12)
Neutro Abs: 6.6 10*3/uL (ref 1.7–7.7)
Neutrophils Relative %: 65 % (ref 43–77)
RBC: 3.45 MIL/uL — ABNORMAL LOW (ref 4.22–5.81)
WBC: 10.2 10*3/uL (ref 4.0–10.5)

## 2010-12-02 LAB — LIPID PANEL
HDL: 24 mg/dL — ABNORMAL LOW (ref >39)
LDL Cholesterol: 30 mg/dL (ref 0–99)
Triglycerides: 231 mg/dL — ABNORMAL HIGH (ref <150)

## 2010-12-02 LAB — POCT GLYCOSYLATED HEMOGLOBIN (HGB A1C): Hemoglobin A1C: 6.3

## 2010-12-02 LAB — TSH: TSH: 2.584 u[IU]/mL (ref 0.350–4.500)

## 2010-12-02 NOTE — Patient Instructions (Signed)
Continue on your present medications. 

## 2010-12-02 NOTE — Progress Notes (Signed)
  Subjective:    Patient ID: Kyle Parrish, male    DOB: 05-09-1912, 77 y.o.   MRN: [id number]  HPI He is here for a recheck. He continues on medications listed in the chart. His weight continues to drop although he says his appetite is good. He has had no nausea, vomiting, bowel habit changes. Does have a previous history of colon cancer. He was seen recently by ophthalmology as well as cardiology. His lifestyle is  unchanged. He is having no chest pain, shortness of breath. He continues on his thyroid medication.   Review of Systems Negative except as above    Objective:   Physical Exam Alert and in no distress. Cardiac exam shows a slightly irregular rhythm with occasional gallop rhythm. Lungs clear to auscultation.      Assessment & Plan:   1. Diabetes mellitus  POCT HgB A1C, POCT UA - Microalbumin  2. Weight loss  CBC w/Diff  3. History of colon cancer    4. ASHD (arteriosclerotic heart disease)    5. Hyperlipidemia LDL goal <70  Lipid Profile  6. Encounter for long-term (current) use of other medications  CBC w/Diff, Comprehensive metabolic panel, Lipid Profile  7. Anemia  CBC w/Diff  8. Diabetic nephropathy  Comprehensive metabolic panel  9. Hypothyroid  TSH   flu shot will be given. Followup pending blood results.

## 2010-12-03 ENCOUNTER — Telehealth: Payer: Self-pay

## 2010-12-03 NOTE — Telephone Encounter (Signed)
Called to inform pt of labs told him what Dr.Lalonde put on labs

## 2010-12-04 ENCOUNTER — Ambulatory Visit: Payer: PRIVATE HEALTH INSURANCE | Admitting: Family Medicine

## 2010-12-12 ENCOUNTER — Other Ambulatory Visit: Payer: Self-pay | Admitting: Family Medicine

## 2010-12-13 ENCOUNTER — Other Ambulatory Visit: Payer: Self-pay | Admitting: Family Medicine

## 2011-01-07 ENCOUNTER — Other Ambulatory Visit: Payer: Self-pay | Admitting: Family Medicine

## 2011-01-13 ENCOUNTER — Telehealth: Payer: Self-pay | Admitting: Family Medicine

## 2011-01-13 MED ORDER — LEVOTHYROXINE SODIUM 88 MCG PO TABS: 88.0000 ug | ORAL_TABLET | Freq: Every day | ORAL | Status: DC

## 2011-01-13 NOTE — Telephone Encounter (Signed)
Addended by: Ronnald Nian on: 01/13/2011 02:02 PM   Modules accepted: Orders

## 2011-01-13 NOTE — Telephone Encounter (Signed)
[facility] FAX REQ FOR LEVOTHYROXIN 88 MCG #30

## 2011-01-13 NOTE — Telephone Encounter (Signed)
Thyroid med was renewed

## 2011-01-25 ENCOUNTER — Telehealth: Payer: Self-pay | Admitting: Family Medicine

## 2011-01-25 NOTE — Telephone Encounter (Signed)
Called and gave the ok

## 2011-01-25 NOTE — Telephone Encounter (Signed)
Call them, okay to change manufacturer

## 2011-04-06 ENCOUNTER — Ambulatory Visit (INDEPENDENT_AMBULATORY_CARE_PROVIDER_SITE_OTHER): Payer: Medicare Other | Admitting: Family Medicine

## 2011-04-06 ENCOUNTER — Encounter: Payer: Self-pay | Admitting: Family Medicine

## 2011-04-06 VITALS — BP 110/60 | HR 48 | Ht 66.0 in | Wt 161.0 lb

## 2011-04-06 DIAGNOSIS — Z23 Encounter for immunization: Secondary | ICD-10-CM

## 2011-04-06 DIAGNOSIS — E1169 Type 2 diabetes mellitus with other specified complication: Secondary | ICD-10-CM | POA: Insufficient documentation

## 2011-04-06 DIAGNOSIS — E119 Type 2 diabetes mellitus without complications: Secondary | ICD-10-CM

## 2011-04-06 DIAGNOSIS — I10 Essential (primary) hypertension: Secondary | ICD-10-CM

## 2011-04-06 DIAGNOSIS — E1159 Type 2 diabetes mellitus with other circulatory complications: Secondary | ICD-10-CM

## 2011-04-06 DIAGNOSIS — E785 Hyperlipidemia, unspecified: Secondary | ICD-10-CM

## 2011-04-06 LAB — POCT UA - MICROALBUMIN
Albumin/Creatinine Ratio, Urine, POC: 236.1
Creatinine, POC: 65.1 mg/dL

## 2011-04-06 NOTE — Progress Notes (Signed)
  Subjective:    Patient ID: Kyle Parrish, male    DOB: 01/06/1913, 78 y.o.   MRN: [id number]  HPI He is here for a recheck. He continues to do quite nicely at home. He is very comfortable with his present living situation and has no psychological problems. He continues to smoke cigars on a daily basis and is not interested in quitting. He continues on medications listed in the chart. He has no particular concerns or complaints. Has an eye exam scheduled for February.   Review of Systems     Objective:   Physical Exam Alert and in no distress. Hemoglobin A1c is recorded. His immunizations will be updated.       Assessment & Plan:   1. Type II or unspecified type diabetes mellitus without mention of complication, not stated as uncontrolled  POCT HgB A1C, POCT UA - Microalbumin  2. Diabetes mellitus    3. Hypertension associated with diabetes    4. Hyperlipidemia LDL goal <70     I will defer the use of an Ace to his cardiologist.

## 2011-04-07 ENCOUNTER — Other Ambulatory Visit: Payer: Self-pay | Admitting: Family Medicine

## 2011-04-13 ENCOUNTER — Other Ambulatory Visit: Payer: Self-pay | Admitting: Family Medicine

## 2011-04-20 ENCOUNTER — Other Ambulatory Visit: Payer: Self-pay | Admitting: Family Medicine

## 2011-05-12 ENCOUNTER — Other Ambulatory Visit: Payer: Self-pay | Admitting: Family Medicine

## 2011-05-12 NOTE — Telephone Encounter (Signed)
Is this ok?

## 2011-05-12 NOTE — Telephone Encounter (Signed)
Renew this 

## 2011-05-12 NOTE — Telephone Encounter (Signed)
Called in.

## 2011-08-04 ENCOUNTER — Ambulatory Visit (INDEPENDENT_AMBULATORY_CARE_PROVIDER_SITE_OTHER): Payer: Medicare Other | Admitting: Family Medicine

## 2011-08-04 ENCOUNTER — Encounter: Payer: Self-pay | Admitting: Family Medicine

## 2011-08-04 DIAGNOSIS — I10 Essential (primary) hypertension: Secondary | ICD-10-CM

## 2011-08-04 DIAGNOSIS — E1169 Type 2 diabetes mellitus with other specified complication: Secondary | ICD-10-CM

## 2011-08-04 DIAGNOSIS — I2510 Atherosclerotic heart disease of native coronary artery without angina pectoris: Secondary | ICD-10-CM

## 2011-08-04 DIAGNOSIS — E119 Type 2 diabetes mellitus without complications: Secondary | ICD-10-CM

## 2011-08-04 DIAGNOSIS — E785 Hyperlipidemia, unspecified: Secondary | ICD-10-CM

## 2011-08-04 LAB — POCT GLYCOSYLATED HEMOGLOBIN (HGB A1C): Hemoglobin A1C: 6.7

## 2011-08-04 NOTE — Progress Notes (Signed)
  Subjective:    Patient ID: Kyle Parrish, male    DOB: 02/23/1913, 78 y.o.   MRN: [id number]  HPI He is here for routine checkup. He continues to do quite well. His medications were reviewed and are listed in the chart. He has no chest pain, shortness of breath. He did recently injure his nose scratching it. He has a previous history of skin cancers. Continues to smoke a cigar regularly. His vision is poor but he does continue to read. Recent laboratory data was reviewed.   Review of Systems     Objective:   Physical Exam Alert and in no distress. Recent trauma to the left side of the bridge of the nose is noted. Multiple actinic keratoses also noted. Hemoglobin A1c is 6.7.       Assessment & Plan:   1. Diabetes mellitus without mention of complication  POCT HgB A1C  2. Diabetes mellitus    3. Hypertension associated with diabetes    4. ASHD (arteriosclerotic heart disease)    5. Hyperlipidemia LDL goal <70     denuded take good care of himself. Recheck here in several months.

## 2011-08-21 ENCOUNTER — Other Ambulatory Visit: Payer: Self-pay | Admitting: Family Medicine

## 2011-08-25 ENCOUNTER — Other Ambulatory Visit: Payer: Self-pay | Admitting: Family Medicine

## 2011-09-10 ENCOUNTER — Other Ambulatory Visit: Payer: Self-pay | Admitting: Family Medicine

## 2011-11-16 ENCOUNTER — Other Ambulatory Visit: Payer: Self-pay | Admitting: Family Medicine

## 2011-11-16 NOTE — Telephone Encounter (Signed)
Is this ok?

## 2011-12-07 ENCOUNTER — Ambulatory Visit: Payer: PRIVATE HEALTH INSURANCE | Admitting: Family Medicine

## 2011-12-17 ENCOUNTER — Encounter: Payer: Self-pay | Admitting: Family Medicine

## 2011-12-17 ENCOUNTER — Ambulatory Visit (INDEPENDENT_AMBULATORY_CARE_PROVIDER_SITE_OTHER): Payer: Medicare Other | Admitting: Family Medicine

## 2011-12-17 VITALS — BP 116/60 | HR 74 | Wt 158.0 lb

## 2011-12-17 DIAGNOSIS — Z23 Encounter for immunization: Secondary | ICD-10-CM

## 2011-12-17 DIAGNOSIS — E119 Type 2 diabetes mellitus without complications: Secondary | ICD-10-CM

## 2011-12-17 DIAGNOSIS — E039 Hypothyroidism, unspecified: Secondary | ICD-10-CM

## 2011-12-17 DIAGNOSIS — I2510 Atherosclerotic heart disease of native coronary artery without angina pectoris: Secondary | ICD-10-CM

## 2011-12-17 DIAGNOSIS — E1169 Type 2 diabetes mellitus with other specified complication: Secondary | ICD-10-CM

## 2011-12-17 DIAGNOSIS — E785 Hyperlipidemia, unspecified: Secondary | ICD-10-CM

## 2011-12-17 DIAGNOSIS — E1159 Type 2 diabetes mellitus with other circulatory complications: Secondary | ICD-10-CM

## 2011-12-17 DIAGNOSIS — Z85038 Personal history of other malignant neoplasm of large intestine: Secondary | ICD-10-CM

## 2011-12-17 DIAGNOSIS — I10 Essential (primary) hypertension: Secondary | ICD-10-CM

## 2011-12-17 MED ORDER — INFLUENZA VIRUS VACC SPLIT PF IM SUSP: 0.5000 mL | Freq: Once | INTRAMUSCULAR | Status: DC

## 2011-12-17 MED ORDER — OMEPRAZOLE 20 MG PO CPDR: 20.0000 mg | DELAYED_RELEASE_CAPSULE | Freq: Every day | ORAL | Status: DC

## 2011-12-17 NOTE — Progress Notes (Signed)
  Subjective:    Patient ID: Kyle Parrish, male    DOB: May 21, 1912, 78 y.o.   MRN: [id number]  HPI He is here for recheck. His weight is down but he states he has actually cut down to just 2 meals per day. He's had no abdominal pain, change in bowel habits, chest pain or shortness of breath. He continues on medications listed in the chart. His past medical history was reviewed He does check blood sugars roughly every other day. He continues on Prilosec and is doing well on that. He still likes his cigars. He gets very little exercise.  Review of Systems     Objective:   Physical Exam alert and in no distress HbA1C 6.5       Assessment & Plan:   1. Diabetes mellitus  POCT glycosylated hemoglobin (Hb A1C)  2. Hypothyroid    3. Hypertension associated with diabetes    4. Hyperlipidemia LDL goal <70    5. ASHD (arteriosclerotic heart disease)    6. History of colon cancer     continue to monitor his weight. Do not feel the need to pursue a workup on him at this time. His granddaughter was here and is in agreement with this. Flu shot was given. Risk and benefit was discussed

## 2012-01-09 ENCOUNTER — Other Ambulatory Visit: Payer: Self-pay | Admitting: Family Medicine

## 2012-01-11 ENCOUNTER — Other Ambulatory Visit: Payer: Self-pay | Admitting: Family Medicine

## 2012-01-21 ENCOUNTER — Other Ambulatory Visit: Payer: Self-pay | Admitting: Family Medicine

## 2012-01-21 NOTE — Telephone Encounter (Signed)
SENT IN MED  

## 2012-02-13 ENCOUNTER — Other Ambulatory Visit: Payer: Self-pay | Admitting: Family Medicine

## 2012-02-16 ENCOUNTER — Other Ambulatory Visit: Payer: Self-pay | Admitting: Family Medicine

## 2012-03-11 ENCOUNTER — Other Ambulatory Visit: Payer: Self-pay | Admitting: Family Medicine

## 2012-04-15 ENCOUNTER — Other Ambulatory Visit: Payer: Self-pay | Admitting: Family Medicine

## 2012-04-21 ENCOUNTER — Other Ambulatory Visit: Payer: Self-pay | Admitting: Family Medicine

## 2012-04-21 ENCOUNTER — Ambulatory Visit (INDEPENDENT_AMBULATORY_CARE_PROVIDER_SITE_OTHER): Payer: Medicare Other | Admitting: Family Medicine

## 2012-04-21 ENCOUNTER — Encounter: Payer: Self-pay | Admitting: Family Medicine

## 2012-04-21 VITALS — BP 100/50 | HR 53 | Wt 154.0 lb

## 2012-04-21 DIAGNOSIS — E1159 Type 2 diabetes mellitus with other circulatory complications: Secondary | ICD-10-CM

## 2012-04-21 DIAGNOSIS — E039 Hypothyroidism, unspecified: Secondary | ICD-10-CM

## 2012-04-21 DIAGNOSIS — E119 Type 2 diabetes mellitus without complications: Secondary | ICD-10-CM

## 2012-04-21 DIAGNOSIS — Z79899 Other long term (current) drug therapy: Secondary | ICD-10-CM

## 2012-04-21 DIAGNOSIS — I10 Essential (primary) hypertension: Secondary | ICD-10-CM

## 2012-04-21 DIAGNOSIS — I2510 Atherosclerotic heart disease of native coronary artery without angina pectoris: Secondary | ICD-10-CM

## 2012-04-21 DIAGNOSIS — Z85038 Personal history of other malignant neoplasm of large intestine: Secondary | ICD-10-CM

## 2012-04-21 DIAGNOSIS — E1169 Type 2 diabetes mellitus with other specified complication: Secondary | ICD-10-CM

## 2012-04-21 DIAGNOSIS — E785 Hyperlipidemia, unspecified: Secondary | ICD-10-CM

## 2012-04-21 NOTE — Progress Notes (Signed)
  Subjective:    Patient ID: Kyle Parrish, male    DOB: 1912/10/31, 79 y.o.   MRN: [id number]  HPI He is here for recheck. He is here with his granddaughter. He states that he doesn't have much of an appetite but does not complaining of abdominal pain, nausea, change in stool habits. Review his record indicates he is slowly losing weight but he has no complaints. He continues on medications listed in the chart. He has no chest pain, shortness of breath, DOE. He does have a previous history of colon cancer.   Review of Systems     Objective:   Physical Exam Alert and in no distress. Cardiac exam shows regular rhythm without murmurs or gallops. Lungs clear to auscultation. Abdominal exam shows no masses or tenderness. Hemoglobin A1c is 6.2       Assessment & Plan:   1. Diabetes mellitus  POCT glycosylated hemoglobin (Hb A1C)  2. Hypertension associated with diabetes  CBC with Differential, Comprehensive metabolic panel  3. Hypothyroid  TSH  4. Hyperlipidemia LDL goal <70  Lipid panel  5. ASHD (arteriosclerotic heart disease)  CBC with Differential, Comprehensive metabolic panel, Digoxin level  6. History of colon cancer  CBC with Differential, Comprehensive metabolic panel  7. Encounter for long-term (current) use of other medications  Lipid panel, CBC with Differential, Comprehensive metabolic panel, TSH, Digoxin level   continue on present medication regimen.

## 2012-04-21 NOTE — Telephone Encounter (Signed)
Refill request for levothyroxine and flomax 0.4mg  to [facility]

## 2012-04-24 LAB — COMPREHENSIVE METABOLIC PANEL
ALT: 14 U/L (ref 0–53)
AST: 18 U/L (ref 0–37)
Albumin: 4 g/dL (ref 3.5–5.2)
Alkaline Phosphatase: 54 U/L (ref 39–117)
BUN: 27 mg/dL — ABNORMAL HIGH (ref 6–23)
Calcium: 9.5 mg/dL (ref 8.4–10.5)
Chloride: 99 mEq/L (ref 96–112)
Potassium: 4.6 mEq/L (ref 3.5–5.3)

## 2012-04-24 LAB — CBC WITH DIFFERENTIAL/PLATELET
Basophils Absolute: 0 10*3/uL (ref 0.0–0.1)
Basophils Relative: 0 % (ref 0–1)
HCT: 32.5 % — ABNORMAL LOW (ref 39.0–52.0)
Lymphocytes Relative: 24 % (ref 12–46)
MCHC: 32.6 g/dL (ref 30.0–36.0)
Neutro Abs: 7.3 10*3/uL (ref 1.7–7.7)
Neutrophils Relative %: 67 % (ref 43–77)
Platelets: 162 10*3/uL (ref 150–400)
RDW: 14.5 % (ref 11.5–15.5)
WBC: 11 10*3/uL — ABNORMAL HIGH (ref 4.0–10.5)

## 2012-04-24 LAB — LIPID PANEL
HDL: 28 mg/dL — ABNORMAL LOW (ref >39)
LDL Cholesterol: 58 mg/dL (ref 0–99)
Total CHOL/HDL Ratio: 4 Ratio
Triglycerides: 126 mg/dL (ref <150)
VLDL: 25 mg/dL (ref 0–40)

## 2012-04-24 NOTE — Progress Notes (Signed)
Quick Note:  PT INFORMED OF LABS HE VERBALIZED UNDERSTANDING ______

## 2012-05-18 ENCOUNTER — Other Ambulatory Visit: Payer: Self-pay | Admitting: Family Medicine

## 2012-06-19 ENCOUNTER — Other Ambulatory Visit: Payer: Self-pay | Admitting: Family Medicine

## 2012-07-19 ENCOUNTER — Other Ambulatory Visit: Payer: Self-pay | Admitting: Family Medicine

## 2012-08-14 ENCOUNTER — Encounter: Payer: Self-pay | Admitting: Internal Medicine

## 2012-08-15 NOTE — Telephone Encounter (Signed)
This encounter was created in error - please disregard.

## 2012-08-18 ENCOUNTER — Telehealth: Payer: Self-pay | Admitting: Internal Medicine

## 2012-08-18 MED ORDER — CARVEDILOL 6.25 MG PO TABS: 6.2500 mg | ORAL_TABLET | Freq: Every day | ORAL | Status: DC

## 2012-08-18 NOTE — Telephone Encounter (Signed)
Pt needed a refill on cardivol to pharmacy

## 2012-08-23 ENCOUNTER — Ambulatory Visit (INDEPENDENT_AMBULATORY_CARE_PROVIDER_SITE_OTHER): Payer: Medicare Other | Admitting: Family Medicine

## 2012-08-23 ENCOUNTER — Encounter: Payer: Self-pay | Admitting: Family Medicine

## 2012-08-23 VITALS — BP 120/70 | HR 58 | Wt 151.0 lb

## 2012-08-23 DIAGNOSIS — E1159 Type 2 diabetes mellitus with other circulatory complications: Secondary | ICD-10-CM

## 2012-08-23 DIAGNOSIS — E1169 Type 2 diabetes mellitus with other specified complication: Secondary | ICD-10-CM

## 2012-08-23 DIAGNOSIS — I2510 Atherosclerotic heart disease of native coronary artery without angina pectoris: Secondary | ICD-10-CM

## 2012-08-23 DIAGNOSIS — I10 Essential (primary) hypertension: Secondary | ICD-10-CM

## 2012-08-23 DIAGNOSIS — M129 Arthropathy, unspecified: Secondary | ICD-10-CM

## 2012-08-23 DIAGNOSIS — L989 Disorder of the skin and subcutaneous tissue, unspecified: Secondary | ICD-10-CM

## 2012-08-23 DIAGNOSIS — M1990 Unspecified osteoarthritis, unspecified site: Secondary | ICD-10-CM

## 2012-08-23 DIAGNOSIS — E119 Type 2 diabetes mellitus without complications: Secondary | ICD-10-CM

## 2012-08-23 DIAGNOSIS — I152 Hypertension secondary to endocrine disorders: Secondary | ICD-10-CM

## 2012-08-23 DIAGNOSIS — Z85038 Personal history of other malignant neoplasm of large intestine: Secondary | ICD-10-CM

## 2012-08-23 DIAGNOSIS — E785 Hyperlipidemia, unspecified: Secondary | ICD-10-CM

## 2012-08-23 MED ORDER — DICLOFENAC SODIUM 1 % TD GEL: TRANSDERMAL | Status: DC

## 2012-08-23 NOTE — Progress Notes (Signed)
  Subjective:    Patient ID: Kyle Parrish, male    DOB: 02/03/1913, 79 y.o.   MRN: [id number]  HPI He is here for a followup visit. He has no particular concerns or complaints other than a lesion on his left forearm.He does continue to lose weight and it does have a previous history of colon cancer. He has had no nausea, vomiting, abdominal pain, change in bowel habits. He does admit to not eating as much as he has in the past. He continues on medications listed in the chart. He is scheduled to see his eye doctor and cardiologist in the near future. He does have difficulty with left knee pain and finds that diclofenac topical is working quite well and would like a refill on this.   Review of Systems     Objective:   Physical Exam Alert and in no distress. Weight is recorded. Exam of the left forearm does show a 2 cm round raised raw-appearing lesion.Hemoglobin A1c is 6.4.       Assessment & Plan:  Diabetes mellitus - Plan: POCT glycosylated hemoglobin (Hb A1C)  Skin lesion - Plan: Ambulatory referral to Dermatology  Arthritis - Plan: diclofenac sodium (VOLTAREN) 1 % GEL  Hypertension associated with diabetes  ASHD (arteriosclerotic heart disease)  Hyperlipidemia LDL goal <70  History of colon cancer  I discussed possible further evaluation of his weight loss and at this time he is not interested in further evaluation.I will continue present medication regimen and see him in 4 months

## 2012-08-25 ENCOUNTER — Ambulatory Visit: Payer: Medicare Other | Admitting: Family Medicine

## 2012-08-25 ENCOUNTER — Other Ambulatory Visit: Payer: Self-pay | Admitting: Medical

## 2012-08-25 ENCOUNTER — Telehealth: Payer: Self-pay | Admitting: Family Medicine

## 2012-08-29 NOTE — Telephone Encounter (Signed)
LM

## 2012-09-08 ENCOUNTER — Other Ambulatory Visit: Payer: Self-pay | Admitting: Family Medicine

## 2012-09-27 ENCOUNTER — Encounter: Payer: Self-pay | Admitting: Family Medicine

## 2012-09-28 ENCOUNTER — Telehealth: Payer: Self-pay | Admitting: Internal Medicine

## 2012-09-28 MED ORDER — OMEPRAZOLE 20 MG PO CPDR: 20.0000 mg | DELAYED_RELEASE_CAPSULE | Freq: Every day | ORAL | Status: DC

## 2012-09-28 NOTE — Telephone Encounter (Signed)
Pt needed prilosec 20mg  sent to [facility] on [address]

## 2012-10-18 ENCOUNTER — Other Ambulatory Visit: Payer: Self-pay

## 2012-10-18 MED ORDER — CARVEDILOL 6.25 MG PO TABS: ORAL_TABLET | ORAL | Status: DC

## 2012-10-27 ENCOUNTER — Ambulatory Visit: Payer: Medicare Other | Attending: Ophthalmology | Admitting: Occupational Therapy

## 2012-10-27 DIAGNOSIS — H53419 Scotoma involving central area, unspecified eye: Secondary | ICD-10-CM | POA: Insufficient documentation

## 2012-10-27 DIAGNOSIS — IMO0001 Reserved for inherently not codable concepts without codable children: Secondary | ICD-10-CM | POA: Insufficient documentation

## 2012-10-27 DIAGNOSIS — H3530 Unspecified macular degeneration: Secondary | ICD-10-CM | POA: Insufficient documentation

## 2012-10-27 DIAGNOSIS — H539 Unspecified visual disturbance: Secondary | ICD-10-CM | POA: Insufficient documentation

## 2012-11-09 ENCOUNTER — Telehealth: Payer: Self-pay | Admitting: Internal Medicine

## 2012-11-09 ENCOUNTER — Other Ambulatory Visit: Payer: Self-pay | Admitting: Family Medicine

## 2012-11-09 NOTE — Telephone Encounter (Signed)
Pharmacy states that pt has been on mylan and they want to change it to sandoz for the levothryoxine. Is this okay?

## 2012-11-10 NOTE — Telephone Encounter (Signed)
Faxed back NO! To pharmacy

## 2012-11-10 NOTE — Telephone Encounter (Signed)
No

## 2012-12-08 ENCOUNTER — Ambulatory Visit (INDEPENDENT_AMBULATORY_CARE_PROVIDER_SITE_OTHER): Payer: Medicare Other | Admitting: Cardiology

## 2012-12-08 ENCOUNTER — Encounter: Payer: Self-pay | Admitting: Cardiology

## 2012-12-08 VITALS — BP 132/50 | HR 53 | Ht 68.0 in | Wt 154.2 lb

## 2012-12-08 DIAGNOSIS — I2510 Atherosclerotic heart disease of native coronary artery without angina pectoris: Secondary | ICD-10-CM

## 2012-12-08 DIAGNOSIS — I255 Ischemic cardiomyopathy: Secondary | ICD-10-CM

## 2012-12-08 DIAGNOSIS — I10 Essential (primary) hypertension: Secondary | ICD-10-CM

## 2012-12-08 DIAGNOSIS — I2589 Other forms of chronic ischemic heart disease: Secondary | ICD-10-CM

## 2012-12-08 DIAGNOSIS — E1169 Type 2 diabetes mellitus with other specified complication: Secondary | ICD-10-CM

## 2012-12-08 DIAGNOSIS — E1159 Type 2 diabetes mellitus with other circulatory complications: Secondary | ICD-10-CM

## 2012-12-08 DIAGNOSIS — E785 Hyperlipidemia, unspecified: Secondary | ICD-10-CM

## 2012-12-08 NOTE — Patient Instructions (Signed)
You are doing great!!!  No changes.  See you back in 1 year.

## 2012-12-11 ENCOUNTER — Encounter: Payer: Self-pay | Admitting: Cardiology

## 2012-12-13 ENCOUNTER — Encounter: Payer: Self-pay | Admitting: Cardiology

## 2012-12-13 DIAGNOSIS — I255 Ischemic cardiomyopathy: Secondary | ICD-10-CM | POA: Insufficient documentation

## 2012-12-13 NOTE — Assessment & Plan Note (Signed)
History of bare-metal stent to the LAD 11 years ago.  He is on aspirin and beta blocker as well as statin.  No active symptoms continues as a physically active with no major complaints. As per Dr. Fredirick Maudlin last note, no intentions of having a further ischemic evaluation. Plan will be to continue conservative medical therapy.

## 2012-12-13 NOTE — Assessment & Plan Note (Signed)
Very well controlled on current medications.

## 2012-12-13 NOTE — Progress Notes (Signed)
PCP: Carollee Herter, MD  Clinic Note: Chief Complaint  Patient presents with  . Annual Exam    no chest pain, no sob ,no edema;      not sure@ all his medication ,he forgot his list    HPI: Kyle Parrish is a 79 y.o. male with a PMH below who presents today for annual followup. He is a very pleasant, very hard of hearing gentleman who is now 11 years out from an anterior MI in the setting of a PUD with a GI bleed back in the summer of 2003.  In the setting of a significant GI bleed, he had an anterior MI with acute systolic heart failure.  EF at that was roughly 40%.  After an endoscopy showed healing ulcers, he underwent cardiac catheterization on 09/25/2001.  A high grade proximal 95% LAD lesion was found on diagnostic angiography.  The other remaining vessels are relatively free of disease.  He underwent PCI with a 3.5 mm x 13 mm Hepacoat (heparin-coated) BMS stent that was post dilated to about 3.7 mm.  Followup Myoview stress test in 2005 demonstrated no ischemia with an EF of 51%. Since that time he has been treated conservatively with no additional testing.  Interval History: He presents today for annual followup.  He has actually no complaints whatsoever.  He is extremely hard of hearing and distal little bit in a hurry to get out of here.  He denies any chest pain or shortness of breath at rest with exertion.  No PND, orthopnea or edema.  No palpitations, heart racing or irregular heartbeats.  No lightheadedness, dizziness and wooziness or syncope/ near syncope.  He does have some occasional heartburn-type sensation with gas and bloating.  At this not say this feel anything at all like chest discomfort him.  None of his associate with walking.    The remainder of Cardiovascular ROS is as follows: no chest pain or dyspnea on exertion negative for - chest pain, edema, irregular heartbeat, loss of consciousness, murmur, orthopnea, palpitations, paroxysmal nocturnal dyspnea, rapid  heart rate or shortness of breath: Additional cardiac review of systems: Lightheadedness - no, dizziness - no, syncope/near-syncope - no; TIA/amaurosis fugax - no Melena - no, hematochezia no; hematuria - no; nosebleeds - no; claudication - no    Past Medical History  Diagnosis Date  . NIDDM (non-insulin dependent diabetes mellitus)   . Nephropathy   . History of ST elevation myocardial infarction (STEMI) of anterior wall June 2003    Anterior MI in the setting of GI bleed; initially treated medically; 10 weeks later underwent cardiac catheterization PCI the LAD.  Marland Kitchen CAD S/P percutaneous coronary angioplasty July 2003    PCI of the LAD  . History of Ischemic cardiomyopathy Summer of 2003    Initial EF of the toe PCI was 40%; by Myoview in 2005 EF is 51% but no ischemia.;  No recurrence of heart failure or following PCI in 2003  . Obesity   . Actinic keratoses   . Colon cancer   . Dyslipidemia   . Gout   . Hypothyroid   . Anemia   . 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 Nephropathy   . Disc herniation   . Hard of hearing      very hard of hearing    No Known Allergies  Current Outpatient Prescriptions  Medication Sig Dispense Refill  . aspirin 81 MG tablet Take 81 mg by mouth daily.        Marland Kitchen  carvedilol (COREG) 6.25 MG tablet TAKE ONE TABLET BY MOUTH ONE TIME DAILY  30 tablet  5  . COLCRYS 0.6 MG tablet TAKE ONE TABLET BY MOUTH ONE TIME DAILY  30 tablet  PRN  . diclofenac sodium (VOLTAREN) 1 % GEL Apply topically 2 or 3 times per day.  20 g  5  . digoxin (LANOXIN) 0.125 MG tablet TAKE ONE TABLET BY MOUTH ONCE DAILY  30 tablet  4  . Dorzolamide HCl-Timolol Mal PF 22.3-6.8 MG/ML SOLN Apply 1 drop to eye 2 (two) times daily.      . folic acid (FOLVITE) 1 MG tablet TAKE ONE TABLET BY MOUTH ONE TIME DAILY  30 tablet  PRN  . latanoprost (XALATAN) 0.005 % ophthalmic solution Place 1 drop into both eyes at bedtime.      Marland Kitchen levothyroxine (SYNTHROID, LEVOTHROID) 88 MCG  tablet TAKE ONE TABLET BY MOUTH ONCE DAILY  30 tablet  0  . Multiple Vitamins-Minerals (MENS MULTIVITAMIN PLUS PO) Take by mouth.        . nabumetone (RELAFEN) 750 MG tablet Take 750 mg by mouth 2 (two) times daily.      Marland Kitchen omeprazole (PRILOSEC) 20 MG capsule Take 1 capsule (20 mg total) by mouth daily.  30 capsule  3  . pioglitazone (ACTOS) 30 MG tablet TAKE ONE TABLET BY MOUTH ONE TIME DAILY  30 tablet  3  . simvastatin (ZOCOR) 40 MG tablet TAKE ONE TABLET BY MOUTH ONE TIME DAILY  30 tablet  3  . tamsulosin (FLOMAX) 0.4 MG CAPS TAKE ONE CAPSULE BY MOUTH ONE TIME DAILY  30 capsule  3   No current facility-administered medications for this visit.    History   Social History Narrative   He is a widowed gentleman with one child and 2 grandchildren.  One of his granddaughters actually lives with him.  Her husband is driving in today.   He usually gets his own meals.  He does sleep quite late into the late morning.  He takes catnaps in the afternoon and stays up till midnight.   He persistently smokes 2 cigars a day and has no intention of quitting.   He used to take a lot of beer but has not drunk for several years.   He likes to travel his family and likes to go out on walks support his weight with his son-in-law.    ROS: A comprehensive Review of Systems - Essentially negative with exception of musculoskeletal pains at pain mostly of left knee greater than his right knee. is very hard of hearing denies any myalgias or arthralgias other than the knees.  No falls.  PHYSICAL EXAM BP 132/50  Pulse 53  Ht 5\' 8"  (1.727 m)  Wt 154 lb 3.2 oz (69.945 kg)  BMI 23.45 kg/m2 General appearance: alert, cooperative, appears stated age, no distress and Healthy-appearing.  Well-nourished well-groomed.  Very hard of hearing Neck: no adenopathy, no carotid bruit, no JVD and supple, symmetrical, trachea midline Lungs: clear to auscultation bilaterally, normal percussion bilaterally and Nonlabored, good air  movement Heart: regular rate and rhythm, S1, S2 normal, no murmur, click, rub or gallop and normal apical impulse Abdomen: soft, non-tender; bowel sounds normal; no masses,  no organomegaly Extremities: extremities normal, atraumatic, no cyanosis or edema, no edema, redness or tenderness in the calves or thighs and no ulcers, gangrene or trophic changes Pulses: 2+ and symmetric Neurologic: Mental status: Alert, oriented, thought content appropriate  [id number] today: Yes Rate: 53 , Rhythm:  Sinus bradycardia, otherwise normal ECG.  Recent Labs: Last cholesterol panel from February 2014: TC 111.2 G1 26, HDL 20, LDL 58.  Essentially goal with exception of low HDL but his improved from 24 in September 2012.  ASSESSMENT / PLAN: Coronary artery disease, with PCI of the LAD; following an anterior MI treated medically. History of bare-metal stent to the LAD 11 years ago.  He is on aspirin and beta blocker as well as statin.  No active symptoms continues as a physically active with no major complaints. As per Dr. Fredirick Maudlin last note, no intentions of having a further ischemic evaluation. Plan will be to continue conservative medical therapy.  Hypertension associated with diabetes Very well controlled on current medications.  Hyperlipidemia LDL goal <70 Relatively well controlled, his anterior cruciate ligament is a little low.  He is on simvastatin.  His anterior cruciate ligament as good as it in several years.  Continue current therapy.  History of Ischemic cardiomyopathy He really has had no Smith heart failure symptoms and quite a few years. He remains on low-dose digoxin and carvedilol. He also on Actos, I would a very careful watching that for potential heart failure exacerbation.  However I think he's been on this for long time.  Will defer to primary physician.     Orders Placed This Encounter  Procedures  . EKG 12-Lead   No orders of the defined types were placed in this  encounter.    Followup: One year  DAVID W. Herbie Baltimore, M.D., M.S. [facility] [address] [place], [place]  [address]  [phone] Pager # [phone]

## 2012-12-13 NOTE — Assessment & Plan Note (Signed)
He really has had no Smith heart failure symptoms and quite a few years. He remains on low-dose digoxin and carvedilol. He also on Actos, I would a very careful watching that for potential heart failure exacerbation.  However I think he's been on this for long time.  Will defer to primary physician.

## 2012-12-13 NOTE — Assessment & Plan Note (Signed)
Relatively well controlled, his anterior cruciate ligament is a little low.  He is on simvastatin.  His anterior cruciate ligament as good as it in several years.  Continue current therapy.

## 2012-12-15 ENCOUNTER — Other Ambulatory Visit: Payer: Self-pay | Admitting: Family Medicine

## 2012-12-15 MED ORDER — LEVOTHYROXINE SODIUM 88 MCG PO TABS: ORAL_TABLET | ORAL | Status: DC

## 2012-12-15 NOTE — Telephone Encounter (Signed)
SENT MED IN 

## 2012-12-15 NOTE — Addendum Note (Signed)
Addended by: Ronnald Nian on: 12/15/2012 04:39 PM   Modules accepted: Orders

## 2012-12-15 NOTE — Telephone Encounter (Signed)
Patient needs refill on thyroid medication, [facility] was supposed to contact us but has not done so almost out of meds

## 2012-12-25 ENCOUNTER — Ambulatory Visit (INDEPENDENT_AMBULATORY_CARE_PROVIDER_SITE_OTHER): Payer: Medicare Other | Admitting: Family Medicine

## 2012-12-25 ENCOUNTER — Encounter: Payer: Self-pay | Admitting: Family Medicine

## 2012-12-25 VITALS — BP 114/70 | HR 50 | Wt 153.0 lb

## 2012-12-25 DIAGNOSIS — E1169 Type 2 diabetes mellitus with other specified complication: Secondary | ICD-10-CM

## 2012-12-25 DIAGNOSIS — I10 Essential (primary) hypertension: Secondary | ICD-10-CM

## 2012-12-25 DIAGNOSIS — Z79899 Other long term (current) drug therapy: Secondary | ICD-10-CM

## 2012-12-25 DIAGNOSIS — E119 Type 2 diabetes mellitus without complications: Secondary | ICD-10-CM

## 2012-12-25 DIAGNOSIS — E785 Hyperlipidemia, unspecified: Secondary | ICD-10-CM

## 2012-12-25 DIAGNOSIS — E039 Hypothyroidism, unspecified: Secondary | ICD-10-CM

## 2012-12-25 DIAGNOSIS — Z23 Encounter for immunization: Secondary | ICD-10-CM

## 2012-12-25 DIAGNOSIS — E1159 Type 2 diabetes mellitus with other circulatory complications: Secondary | ICD-10-CM

## 2012-12-25 LAB — COMPREHENSIVE METABOLIC PANEL
ALT: 13 U/L (ref 0–53)
Albumin: 3.8 g/dL (ref 3.5–5.2)
Alkaline Phosphatase: 42 U/L (ref 39–117)
Calcium: 9.2 mg/dL (ref 8.4–10.5)
Creat: 1.85 mg/dL — ABNORMAL HIGH (ref 0.50–1.35)
Glucose, Bld: 115 mg/dL — ABNORMAL HIGH (ref 70–99)
Potassium: 4.2 mEq/L (ref 3.5–5.3)
Sodium: 129 mEq/L — ABNORMAL LOW (ref 135–145)
Total Bilirubin: 0.4 mg/dL (ref 0.3–1.2)
Total Protein: 6.2 g/dL (ref 6.0–8.3)

## 2012-12-25 LAB — LIPID PANEL
LDL Cholesterol: 35 mg/dL (ref 0–99)
Triglycerides: 177 mg/dL — ABNORMAL HIGH (ref <150)
VLDL: 35 mg/dL (ref 0–40)

## 2012-12-25 LAB — CBC WITH DIFFERENTIAL/PLATELET
Basophils Relative: 0 % (ref 0–1)
Eosinophils Absolute: 0.5 10*3/uL (ref 0.0–0.7)
Hemoglobin: 11.3 g/dL — ABNORMAL LOW (ref 13.0–17.0)
Lymphs Abs: 2.6 10*3/uL (ref 0.7–4.0)
MCH: 31.1 pg (ref 26.0–34.0)
MCHC: 33.3 g/dL (ref 30.0–36.0)
MCV: 93.4 fL (ref 78.0–100.0)
Monocytes Absolute: 0.8 10*3/uL (ref 0.1–1.0)
Monocytes Relative: 8 % (ref 3–12)
Neutro Abs: 5.9 10*3/uL (ref 1.7–7.7)
Neutrophils Relative %: 61 % (ref 43–77)
Platelets: 157 10*3/uL (ref 150–400)
RBC: 3.63 MIL/uL — ABNORMAL LOW (ref 4.22–5.81)
RDW: 14.4 % (ref 11.5–15.5)

## 2012-12-25 LAB — POCT GLYCOSYLATED HEMOGLOBIN (HGB A1C): Hemoglobin A1C: 6

## 2012-12-25 NOTE — Progress Notes (Signed)
  Subjective:    Kyle Parrish is a 79 y.o. male who presents for follow-up of Type 2 diabetes mellitus.    Home blood sugar records: HE IS CHECKING SAID OK  Current symptoms/problems NONE Daily foot checks: Any foot concerns: NO Last eye exam:  DR.CARP  [id number]   Medication compliance: Excellent Current diet: NONE Current exercise: WALKING Known diabetic complications: cardiovascular disease Cardiovascular risk factors: advanced age (older than 48 for men, 109 for women), diabetes mellitus, dyslipidemia, hypertension, male gender and sedentary lifestyle He is now 79 years old and is doing quite well for himself.  The following portions of the patient's history were reviewed and updated as appropriate: allergies, current medications, past medical history, past social history and problem list.  ROS as in subjective above    Objective:   General appearence: alert, no distress, WD/WN Foot exam:  Neuro: foot monofilament exam normal   Lab Review Lab Results  Component Value Date   HGBA1C 6.4 08/23/2012   Lab Results  Component Value Date   CHOL 111 04/21/2012   HDL 28* 04/21/2012   LDLCALC 58 04/21/2012   TRIG 126 04/21/2012   CHOLHDL 4.0 04/21/2012   No results found for this basenameConcepcion Elk     Chemistry      Component Value Date/Time   NA 131* 04/21/2012 0950   K 4.6 04/21/2012 0950   CL 99 04/21/2012 0950   CO2 24 04/21/2012 0950   BUN 27* 04/21/2012 0950   CREATININE 1.80* 04/21/2012 0950   CREATININE 1.64* 04/03/2008 0615      Component Value Date/Time   CALCIUM 9.5 04/21/2012 0950   ALKPHOS 54 04/21/2012 0950   AST 18 04/21/2012 0950   ALT 14 04/21/2012 0950   BILITOT 0.5 04/21/2012 0950        Chemistry      Component Value Date/Time   NA 131* 04/21/2012 0950   K 4.6 04/21/2012 0950   CL 99 04/21/2012 0950   CO2 24 04/21/2012 0950   BUN 27* 04/21/2012 0950   CREATININE 1.80* 04/21/2012 0950   CREATININE 1.64* 04/03/2008 0615      Component Value Date/Time   CALCIUM  9.5 04/21/2012 0950   ALKPHOS 54 04/21/2012 0950   AST 18 04/21/2012 0950   ALT 14 04/21/2012 0950   BILITOT 0.5 04/21/2012 0950           Assessment:  Diabetes mellitus - Plan: POCT glycosylated hemoglobin (Hb A1C), CBC with Differential, Comprehensive metabolic panel, Lipid panel  Need for prophylactic vaccination and inoculation against influenza - Plan: Flu vaccine HIGH DOSE PF (Fluzone Tri High dose)  Hypertension associated with diabetes - Plan: CBC with Differential, Comprehensive metabolic panel  Hyperlipidemia LDL goal <70 - Plan: Lipid panel  Hypothyroid - Plan: TSH  Encounter for long-term (current) use of other medications - Plan: CBC with Differential, Comprehensive metabolic panel, Lipid panel, TSH        Plan:    1.  Rx changes: none 2.  Education: Reviewed 'ABCs' of diabetes management (respective goals in parentheses):  A1C (<7), blood pressure (<130/80), and cholesterol (LDL <100). 3.  Compliance at present is estimated to be excellent. Efforts to improve compliance (if necessary) will be directed at None. 4. Follow up: 4 months

## 2012-12-26 ENCOUNTER — Telehealth: Payer: Self-pay | Admitting: Family Medicine

## 2012-12-26 LAB — TSH: TSH: 3.139 u[IU]/mL (ref 0.350–4.500)

## 2012-12-26 NOTE — Telephone Encounter (Signed)
oo

## 2013-02-15 ENCOUNTER — Other Ambulatory Visit: Payer: Self-pay | Admitting: Family Medicine

## 2013-02-16 ENCOUNTER — Telehealth: Payer: Self-pay | Admitting: Internal Medicine

## 2013-02-16 ENCOUNTER — Other Ambulatory Visit: Payer: Self-pay

## 2013-02-16 MED ORDER — DIGOXIN 125 MCG PO TABS: ORAL_TABLET | ORAL | Status: DC

## 2013-02-16 MED ORDER — OMEPRAZOLE 20 MG PO CPDR: 20.0000 mg | DELAYED_RELEASE_CAPSULE | Freq: Every day | ORAL | Status: DC

## 2013-02-16 NOTE — Telephone Encounter (Signed)
SENT IN DIGOXIN AND OMEPRAZOLE

## 2013-02-16 NOTE — Telephone Encounter (Signed)
Refill request for digoxin 0.125mg  #30, omeprazole 20mg  #30 to [facility] [address]

## 2013-02-16 NOTE — Telephone Encounter (Signed)
DONE

## 2013-04-20 ENCOUNTER — Telehealth: Payer: Self-pay | Admitting: Family Medicine

## 2013-04-20 ENCOUNTER — Telehealth: Payer: Self-pay | Admitting: Internal Medicine

## 2013-04-20 MED ORDER — CARVEDILOL 6.25 MG PO TABS: ORAL_TABLET | ORAL | Status: DC

## 2013-04-20 NOTE — Telephone Encounter (Signed)
Refill x 30 days, due for f/u at this time

## 2013-04-20 NOTE — Telephone Encounter (Signed)
Sent in med

## 2013-04-20 NOTE — Telephone Encounter (Signed)
Pt already has an appt on 2/13

## 2013-04-23 ENCOUNTER — Other Ambulatory Visit: Payer: Self-pay | Admitting: Family Medicine

## 2013-04-23 MED ORDER — CARVEDILOL 6.25 MG PO TABS: ORAL_TABLET | ORAL | Status: DC

## 2013-04-27 ENCOUNTER — Ambulatory Visit: Payer: PRIVATE HEALTH INSURANCE | Admitting: Family Medicine

## 2013-05-02 ENCOUNTER — Encounter: Payer: Self-pay | Admitting: Family Medicine

## 2013-05-02 ENCOUNTER — Ambulatory Visit: Payer: PRIVATE HEALTH INSURANCE | Admitting: Family Medicine

## 2013-05-02 ENCOUNTER — Ambulatory Visit (INDEPENDENT_AMBULATORY_CARE_PROVIDER_SITE_OTHER): Payer: Medicare Other | Admitting: Family Medicine

## 2013-05-02 VITALS — BP 126/72 | HR 49 | Wt 150.0 lb

## 2013-05-02 DIAGNOSIS — E878 Other disorders of electrolyte and fluid balance, not elsewhere classified: Secondary | ICD-10-CM

## 2013-05-02 DIAGNOSIS — E1159 Type 2 diabetes mellitus with other circulatory complications: Secondary | ICD-10-CM

## 2013-05-02 DIAGNOSIS — E785 Hyperlipidemia, unspecified: Secondary | ICD-10-CM

## 2013-05-02 DIAGNOSIS — E1169 Type 2 diabetes mellitus with other specified complication: Secondary | ICD-10-CM | POA: Diagnosis not present

## 2013-05-02 DIAGNOSIS — M1990 Unspecified osteoarthritis, unspecified site: Secondary | ICD-10-CM | POA: Insufficient documentation

## 2013-05-02 DIAGNOSIS — B353 Tinea pedis: Secondary | ICD-10-CM

## 2013-05-02 DIAGNOSIS — E039 Hypothyroidism, unspecified: Secondary | ICD-10-CM

## 2013-05-02 DIAGNOSIS — M129 Arthropathy, unspecified: Secondary | ICD-10-CM

## 2013-05-02 DIAGNOSIS — E119 Type 2 diabetes mellitus without complications: Secondary | ICD-10-CM | POA: Diagnosis not present

## 2013-05-02 DIAGNOSIS — I152 Hypertension secondary to endocrine disorders: Secondary | ICD-10-CM

## 2013-05-02 DIAGNOSIS — I10 Essential (primary) hypertension: Secondary | ICD-10-CM

## 2013-05-02 LAB — POCT GLYCOSYLATED HEMOGLOBIN (HGB A1C): Hemoglobin A1C: 6.3

## 2013-05-02 LAB — ELECTROLYTE PANEL
CHLORIDE: 102 meq/L (ref 96–112)
CO2: 27 meq/L (ref 19–32)
Potassium: 4.3 mEq/L (ref 3.5–5.3)
SODIUM: 136 meq/L (ref 135–145)

## 2013-05-02 MED ORDER — PIOGLITAZONE HCL 30 MG PO TABS
ORAL_TABLET | ORAL | Status: DC
Start: 2013-05-02 — End: 2013-11-01

## 2013-05-02 MED ORDER — DICLOFENAC SODIUM 1 % TD GEL: TRANSDERMAL | Status: DC

## 2013-05-02 NOTE — Progress Notes (Signed)
   Subjective:    Patient ID: Kyle Parrish, male    DOB: April 25, 1912, 80 y.o.   MRN: [id number]  HPI Is here for a recheck. He checks his blood sugars several times per week and they are always in the low 100s. His eating habits are unchanged. He does smoke a cigar once in a while. Physical activity is limited due to his age and arthritis. He does use a topical arthritis cream which does seem to help. Continues on thyroid medications. He does not complain of any foot problems and he had an evaluation with his last visit. Review his record indicates a low sodium and he was told to increase his salt intake. He does not drink. His granddaughter helps take care of him. He has had a recent eye exam.   Review of Systems     Objective:   Physical Exam Alert and in no distress. Hemoglobin A1c is 6.3       Assessment & Plan:  Diabetes mellitus - Plan: POCT glycosylated hemoglobin (Hb A1C), pioglitazone (ACTOS) 30 MG tablet  Hyperlipidemia LDL goal <70  Hypertension associated with diabetes  Hypothyroid  Tinea pedis  Electrolyte abnormality - Plan: Electrolyte panel  Arthritis - Plan: diclofenac sodium (VOLTAREN) 1 % GEL  discussed the arthritis and he is doing well with the Voltaren and we will therefore leave it alone. He will continue on all of his other medications.

## 2013-06-17 ENCOUNTER — Other Ambulatory Visit: Payer: Self-pay | Admitting: Family Medicine

## 2013-07-16 ENCOUNTER — Other Ambulatory Visit: Payer: Self-pay | Admitting: Family Medicine

## 2013-07-19 ENCOUNTER — Other Ambulatory Visit: Payer: Self-pay | Admitting: Family Medicine

## 2013-08-19 ENCOUNTER — Other Ambulatory Visit: Payer: Self-pay | Admitting: Family Medicine

## 2013-08-24 ENCOUNTER — Ambulatory Visit: Payer: Medicare Other | Admitting: Family Medicine

## 2013-09-07 ENCOUNTER — Ambulatory Visit: Payer: Medicare Other | Admitting: Family Medicine

## 2013-09-10 ENCOUNTER — Other Ambulatory Visit: Payer: Self-pay | Admitting: Family Medicine

## 2013-09-21 ENCOUNTER — Encounter: Payer: Self-pay | Admitting: Family Medicine

## 2013-09-21 ENCOUNTER — Ambulatory Visit (INDEPENDENT_AMBULATORY_CARE_PROVIDER_SITE_OTHER): Payer: Medicare Other | Admitting: Family Medicine

## 2013-09-21 ENCOUNTER — Other Ambulatory Visit: Payer: Self-pay | Admitting: Family Medicine

## 2013-09-21 VITALS — BP 110/60 | HR 50 | Wt 151.0 lb

## 2013-09-21 DIAGNOSIS — E1121 Type 2 diabetes mellitus with diabetic nephropathy: Secondary | ICD-10-CM

## 2013-09-21 DIAGNOSIS — I10 Essential (primary) hypertension: Secondary | ICD-10-CM

## 2013-09-21 DIAGNOSIS — E1129 Type 2 diabetes mellitus with other diabetic kidney complication: Secondary | ICD-10-CM

## 2013-09-21 DIAGNOSIS — E1159 Type 2 diabetes mellitus with other circulatory complications: Secondary | ICD-10-CM

## 2013-09-21 DIAGNOSIS — E1169 Type 2 diabetes mellitus with other specified complication: Secondary | ICD-10-CM

## 2013-09-21 DIAGNOSIS — E785 Hyperlipidemia, unspecified: Secondary | ICD-10-CM

## 2013-09-21 DIAGNOSIS — N058 Unspecified nephritic syndrome with other morphologic changes: Secondary | ICD-10-CM

## 2013-09-21 DIAGNOSIS — I152 Hypertension secondary to endocrine disorders: Secondary | ICD-10-CM

## 2013-09-21 LAB — POCT UA - MICROALBUMIN
ALBUMIN/CREATININE RATIO, URINE, POC: 263.9
CREATININE, POC: 74.2 mg/dL
MICROALBUMIN (UR) POC: 195.8 mg/L

## 2013-09-21 LAB — POCT GLYCOSYLATED HEMOGLOBIN (HGB A1C): Hemoglobin A1C: 6

## 2013-09-21 NOTE — Progress Notes (Signed)
  Subjective:    Kyle Parrish is a 80 y.o. male who presents for follow-up of Type 2 diabetes mellitus.    Home blood sugar records: 90  Current symptoms/problems none Daily foot checks:   Any foot concerns: no Last eye exam:  Dr.curupt 6/15   Medication compliance: Current diet: none Current exercise: walking daily Known diabetic complications: cardiovascular disease Cardiovascular risk factors: advanced age (older than 52 for men, 27 for women), diabetes mellitus, dyslipidemia, hypertension, male gender, sedentary lifestyle and smoking/ tobacco exposure   The following portions of the patient's history were reviewed and updated as appropriate: allergies, current medications, past family history, past medical history, past social history and problem list.  ROS as in subjective above    Objective:    General appearence: alert, no distress, WD/WN  Lab Review Lab Results  Component Value Date   HGBA1C 6.3% 05/02/2013   Lab Results  Component Value Date   CHOL 97 12/25/2012   HDL 27* 12/25/2012   LDLCALC 35 12/25/2012   TRIG 177* 12/25/2012   CHOLHDL 3.6 12/25/2012   No results found for this basenameDerl Barrow     Chemistry      Component Value Date/Time   NA 136 05/02/2013 1042   K 4.3 05/02/2013 1042   CL 102 05/02/2013 1042   CO2 27 05/02/2013 1042   BUN 27* 12/25/2012 0956   CREATININE 1.85* 12/25/2012 0956   CREATININE 1.64* 04/03/2008 0615      Component Value Date/Time   CALCIUM 9.2 12/25/2012 0956   ALKPHOS 42 12/25/2012 0956   AST 17 12/25/2012 0956   ALT 13 12/25/2012 0956   BILITOT 0.4 12/25/2012 0956        Chemistry      Component Value Date/Time   NA 136 05/02/2013 1042   K 4.3 05/02/2013 1042   CL 102 05/02/2013 1042   CO2 27 05/02/2013 1042   BUN 27* 12/25/2012 0956   CREATININE 1.85* 12/25/2012 0956   CREATININE 1.64* 04/03/2008 0615      Component Value Date/Time   CALCIUM 9.2 12/25/2012 0956   ALKPHOS 42 12/25/2012 0956    AST 17 12/25/2012 0956   ALT 13 12/25/2012 0956   BILITOT 0.4 12/25/2012 0956       Hemoglobin A1c is 6.0  Assessment:   Encounter Diagnoses  Name Primary?  . Hypertension associated with diabetes Yes  . Hyperlipidemia LDL goal <70   . Type 2 diabetes mellitus with diabetic nephropathy          Plan:    1.  Rx changes: none 2.  Education: Reviewed 'ABCs' of diabetes management (respective goals in parentheses):  A1C (<7), blood pressure (<130/80), and cholesterol (LDL <100). 3.  Compliance at present is estimated to be good. Efforts to improve compliance (if necessary) will be directed at None. 4. Follow up: 4 months

## 2013-11-01 ENCOUNTER — Other Ambulatory Visit: Payer: Self-pay | Admitting: Family Medicine

## 2013-12-03 ENCOUNTER — Telehealth: Payer: Self-pay | Admitting: Family Medicine

## 2013-12-03 ENCOUNTER — Other Ambulatory Visit: Payer: Self-pay | Admitting: Family Medicine

## 2013-12-03 ENCOUNTER — Other Ambulatory Visit: Payer: Self-pay

## 2013-12-03 MED ORDER — LEVOTHYROXINE SODIUM 88 MCG PO TABS: ORAL_TABLET | ORAL | Status: DC

## 2013-12-03 NOTE — Telephone Encounter (Signed)
DONE

## 2014-01-11 ENCOUNTER — Encounter: Payer: Self-pay | Admitting: Cardiology

## 2014-01-11 ENCOUNTER — Ambulatory Visit (INDEPENDENT_AMBULATORY_CARE_PROVIDER_SITE_OTHER): Payer: Medicare Other | Admitting: Cardiology

## 2014-01-11 VITALS — BP 132/56 | HR 55 | Ht 68.0 in | Wt 149.0 lb

## 2014-01-11 DIAGNOSIS — E1159 Type 2 diabetes mellitus with other circulatory complications: Secondary | ICD-10-CM

## 2014-01-11 DIAGNOSIS — I152 Hypertension secondary to endocrine disorders: Secondary | ICD-10-CM

## 2014-01-11 DIAGNOSIS — I2510 Atherosclerotic heart disease of native coronary artery without angina pectoris: Secondary | ICD-10-CM

## 2014-01-11 DIAGNOSIS — E1169 Type 2 diabetes mellitus with other specified complication: Secondary | ICD-10-CM

## 2014-01-11 DIAGNOSIS — I255 Ischemic cardiomyopathy: Secondary | ICD-10-CM

## 2014-01-11 DIAGNOSIS — I10 Essential (primary) hypertension: Secondary | ICD-10-CM

## 2014-01-11 DIAGNOSIS — E785 Hyperlipidemia, unspecified: Secondary | ICD-10-CM

## 2014-01-11 NOTE — Patient Instructions (Signed)
Please follow up in 6 months with Dr. Ellyn Hack.

## 2014-01-13 ENCOUNTER — Encounter: Payer: Self-pay | Admitting: Cardiology

## 2014-01-13 NOTE — Assessment & Plan Note (Signed)
Again relatively well-controlled. No recent labs, but the last labs from October 2014 were well-controlled with exception of HDL being low at 27. LDL was 35 in total cholesterol was 97, triglycerides 177. HDL being low is probably a factor the total cholesterol being low. He is on a steady dose of simvastatin

## 2014-01-13 NOTE — Progress Notes (Signed)
PCP: Wyatt Haste, MD  Clinic Note: Chief Complaint  Patient presents with  . Follow-up    ~1 year, No CP, +DOE    HPI: Kyle Parrish is a 80 y.o. male with a PMH below who presents today for ~ 1 yr follow-up.  He is a very pleasant elderly gentleman with a history of anterior MI in 2003 in the setting of peptic ulcer disease and GI bleed. He initially had some ischemic cardiovascular EF are up to 40% the subsequently been evaluated an echocardiogram in 2001 showed a normal EF.following stabilization of his Jevity 2003 he was found a high-grade 90% proximal LAD lesion treated with a HepaCoat BMS 2.5 mm x 13 mm (3.7 mm).  Since his most recent evaluation with an echocardiogram 2010, he has voiced the opinion that he would prefer just medical/conservative management and no additional testing.    Past Medical History  Diagnosis Date  . NIDDM (non-insulin dependent diabetes mellitus)   . Nephropathy   . History of ST elevation myocardial infarction (STEMI) of anterior wall June 2003    Anterior MI in the setting of GI bleed; initially treated medically; 10 weeks later underwent cardiac catheterization PCI the LAD.  Marland Kitchen CAD S/P percutaneous coronary angioplasty July 2003    PCI of the LAD - HepaCoat BMS 2.5 mm x 13 mm (3.7 mm).  . History of Ischemic cardiomyopathy Summer of 2003 - 2010    2003: Initial EF Post PCI was 40%; by Myoview in 2005 EF is 51% but no ischemia.;  No recurrence of heart failure or following PCI in 2003;; Echo 03/2008: Normal LV size & function, Gr 1 DD with elevated LAP, Mild Ao sclerosis  . Obesity   . Actinic keratoses   . Colon cancer   . Dyslipidemia   . Gout   . Hypothyroid   . Anemia   . 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 Nephropathy   . Disc herniation   . Hard of hearing      very hard of hearing    Prior Cardiac Evaluation and Past Surgical History: Past Surgical History  Procedure Laterality Date  . Colectomy  2003  .  Colonoscopy  2008    Interval History: He presents today stating that "he feels pretty good," with no real complaints.he does note that he is having to walk as much he use to, mostly related to fatigue and dyspnea. He denies any resting or exertional chest tightness or pressure however. He doesn't really note, but does have mild lower edema No PND, orthopnea. No palpitations, lightheadedness, dizziness, weakness or syncope/near syncope. No TIA/amaurosis fugax symptoms. In general, he basically thinks he does very well for someone his age.  ROS: A comprehensive was performed. Review of Systems  Constitutional: Negative for fever, chills, weight loss and malaise/fatigue.       Subjective wgt loss - scales do not agree  HENT: Negative for nosebleeds.   Respiratory: Negative for cough, shortness of breath and wheezing.   Cardiovascular: Positive for leg swelling. Negative for claudication.       Per HPI  Gastrointestinal: Negative for blood in stool and melena.  Genitourinary: Negative for hematuria.  Musculoskeletal: Positive for back pain and joint pain.  Neurological: Negative for dizziness, sensory change, speech change, focal weakness, seizures and loss of consciousness.  Endo/Heme/Allergies: Does not bruise/bleed easily.  Psychiatric/Behavioral: Negative for depression. The patient is not nervous/anxious.   All other systems reviewed and  are negative.   Current Outpatient Prescriptions on File Prior to Visit  Medication Sig Dispense Refill  . aspirin 81 MG tablet Take 81 mg by mouth daily.      . carvedilol (COREG) 6.25 MG tablet TAKE ONE TABLET BY MOUTH ONCE DAILY 30 tablet 0  . carvedilol (COREG) 6.25 MG tablet TAKE ONE TABLET BY MOUTH ONCE DAILY 30 tablet 5  . COLCRYS 0.6 MG tablet TAKE ONE TABLET BY MOUTH ONE TIME DAILY 30 tablet PRN  . diclofenac sodium (VOLTAREN) 1 % GEL Apply topically 2 or 3 times per day. 20 g 5  . digoxin (LANOXIN) 0.125 MG tablet TAKE ONE TABLET BY MOUTH  ONCE DAILY 30 tablet 1  . Dorzolamide HCl-Timolol Mal PF 22.3-6.8 MG/ML SOLN Apply 1 drop to eye 2 (two) times daily.    . folic acid (FOLVITE) 1 MG tablet TAKE ONE TABLET BY MOUTH ONCE DAILY 30 tablet 11  . latanoprost (XALATAN) 0.005 % ophthalmic solution Place 1 drop into both eyes at bedtime.    Marland Kitchen levothyroxine (SYNTHROID, LEVOTHROID) 88 MCG tablet TAKE ONE TABLET BY MOUTH ONCE DAILY 30 tablet 1  . Multiple Vitamins-Minerals (MENS MULTIVITAMIN PLUS PO) Take by mouth.      . nabumetone (RELAFEN) 750 MG tablet Take 750 mg by mouth 2 (two) times daily.    Marland Kitchen omeprazole (PRILOSEC) 20 MG capsule TAKE ONE CAPSULE BY MOUTH ONCE DAILY 30 capsule 0  . omeprazole (PRILOSEC) 20 MG capsule TAKE ONE CAPSULE BY MOUTH ONCE DAILY 30 capsule 1  . pioglitazone (ACTOS) 30 MG tablet TAKE ONE TABLET BY MOUTH ONCE DAILY 30 tablet 4  . simvastatin (ZOCOR) 40 MG tablet TAKE ONE TABLET BY MOUTH ONCE DAILY 90 tablet 1  . tamsulosin (FLOMAX) 0.4 MG CAPS capsule TAKE ONE CAPSULE BY MOUTH ONCE DAILY 90 capsule 1   No current facility-administered medications on file prior to visit.   ALLERGIES REVIEWED IN EPIC -- No change SOCIAL AND FAMILY HISTORY REVIEWED IN EPIC -- No change  Wt Readings from Last 3 Encounters:  01/11/14 149 lb (67.586 kg)  09/21/13 151 lb (68.493 kg)  05/02/13 150 lb (68.04 kg)   PHYSICAL EXAM  BP 132/56 mmHg  Pulse 55  Ht 5\' 8"  (1.727 m)  Wt 149 lb (67.586 kg)  BMI 22.66 kg/m2  General appearance: alert, cooperative, appears stated age, no distress and Healthy-appearing. Well-nourished well-groomed. Very hard of hearing  Neck: no adenopathy, no carotid bruit, no JVD and supple, symmetrical, trachea midline  Lungs: clear to auscultation bilaterally, normal percussion bilaterally and Nonlabored, good air movement  Heart: bradycardia with regular rhythm, S1&S2 normal, no murmur, click, rub or gallop and normal apical impulse  Abdomen: soft, non-tender; bowel sounds normal; no masses, no  organomegaly  Extremities: extremities normal, atraumatic, no cyanosis with mild; No, redness or tenderness in the calves or thighs and no ulcers, gangrene or trophic changes  Pulses: 2+ and symmetric  Neurologic: Mental status: Alert, oriented, thought content appropriate; very hard of hearing, poor memory   Adult ECG Report  Rate: 55 ; Rhythm: sinus bradycardia and Left Anterior Fascicular Block (- 47)  Narrative Interpretation: otherwise normal EKG   Recent Labs: None check since last year - he is due to be evaluated by PCP in the next month   ASSESSMENT / PLAN: No problem-specific assessment & plan notes found for this encounter.   No orders of the defined types were placed in this encounter.   Meds ordered this encounter  Medications  .  fluticasone (CUTIVATE) 0.05 % cream    Sig:     Followup: 1 yr   Leonie Man, M.D., M.S. Interventional Cardiologist   Pager # [phone]

## 2014-01-13 NOTE — Assessment & Plan Note (Signed)
Status post PCI 12 years ago and he is to be on aspirin, beta blockers & statin. No active anginal symptoms although does have some exertional dyspnea. He is not interested in any further evaluation. Continue conservative medical therapy.

## 2014-01-13 NOTE — Assessment & Plan Note (Signed)
Repeat echocardiogram in 2002 and showed normal EF. The only symptom he notes exertional dyspnea. He is on good dose of beta blocker and digoxin. No true heart failure symptoms of PND, orthopnea. He has mild lower extremity edema which is probably not all that for someone his age with probable venous stasis and borderline nutrition.

## 2014-01-13 NOTE — Assessment & Plan Note (Signed)
Blood pressure is good today on current medication.

## 2014-01-25 ENCOUNTER — Ambulatory Visit (INDEPENDENT_AMBULATORY_CARE_PROVIDER_SITE_OTHER): Payer: Medicare Other | Admitting: Family Medicine

## 2014-01-25 ENCOUNTER — Encounter: Payer: Self-pay | Admitting: Family Medicine

## 2014-01-25 VITALS — BP 140/50 | HR 54 | Wt 149.0 lb

## 2014-01-25 DIAGNOSIS — M1990 Unspecified osteoarthritis, unspecified site: Secondary | ICD-10-CM

## 2014-01-25 DIAGNOSIS — E785 Hyperlipidemia, unspecified: Secondary | ICD-10-CM

## 2014-01-25 DIAGNOSIS — I10 Essential (primary) hypertension: Secondary | ICD-10-CM

## 2014-01-25 DIAGNOSIS — Z23 Encounter for immunization: Secondary | ICD-10-CM

## 2014-01-25 DIAGNOSIS — E1121 Type 2 diabetes mellitus with diabetic nephropathy: Secondary | ICD-10-CM

## 2014-01-25 DIAGNOSIS — I2510 Atherosclerotic heart disease of native coronary artery without angina pectoris: Secondary | ICD-10-CM

## 2014-01-25 DIAGNOSIS — E039 Hypothyroidism, unspecified: Secondary | ICD-10-CM

## 2014-01-25 DIAGNOSIS — E1169 Type 2 diabetes mellitus with other specified complication: Secondary | ICD-10-CM

## 2014-01-25 DIAGNOSIS — I152 Hypertension secondary to endocrine disorders: Secondary | ICD-10-CM

## 2014-01-25 DIAGNOSIS — E1159 Type 2 diabetes mellitus with other circulatory complications: Secondary | ICD-10-CM

## 2014-01-25 LAB — CBC WITH DIFFERENTIAL/PLATELET
Basophils Absolute: 0 10*3/uL (ref 0.0–0.1)
Basophils Relative: 0 % (ref 0–1)
EOS ABS: 0.6 10*3/uL (ref 0.0–0.7)
Eosinophils Relative: 6 % — ABNORMAL HIGH (ref 0–5)
HCT: 31.7 % — ABNORMAL LOW (ref 39.0–52.0)
HEMOGLOBIN: 10.5 g/dL — AB (ref 13.0–17.0)
LYMPHS ABS: 2.6 10*3/uL (ref 0.7–4.0)
LYMPHS PCT: 27 % (ref 12–46)
MCH: 30.8 pg (ref 26.0–34.0)
MCHC: 33.1 g/dL (ref 30.0–36.0)
MCV: 93 fL (ref 78.0–100.0)
MONOS PCT: 9 % (ref 3–12)
Monocytes Absolute: 0.9 10*3/uL (ref 0.1–1.0)
NEUTROS PCT: 58 % (ref 43–77)
Neutro Abs: 5.6 10*3/uL (ref 1.7–7.7)
Platelets: 161 10*3/uL (ref 150–400)
RBC: 3.41 MIL/uL — AB (ref 4.22–5.81)
RDW: 14.2 % (ref 11.5–15.5)
WBC: 9.7 10*3/uL (ref 4.0–10.5)

## 2014-01-25 LAB — TSH: TSH: 1.632 u[IU]/mL (ref 0.350–4.500)

## 2014-01-25 LAB — POCT GLYCOSYLATED HEMOGLOBIN (HGB A1C): HEMOGLOBIN A1C: 6.3

## 2014-01-25 NOTE — Progress Notes (Signed)
Subjective:    Kyle Parrish is a 80 y.o. male who presents for follow-up of Type 2 diabetes mellitus.  He recently saw his cardiologist and got a good report back.he has had difficulty with arthritis in the past and has had steroid as well as Synvisc injections with some relief of his symptoms. Presently he is using topical Voltaren with some results.  Home blood sugar records:  Patient test everyother day 90's to 100  Current symptoms/problems include Daily foot checks:  Any foot concerns: none Dr.Harden last week Last eye exam:   Dr.Kurup looks the same 10/2013   Medication compliance:good Current diet: none Current exercise: goes out to stores and walks around Known diabetic complications: cardiovascular disease Cardiovascular risk factors: advanced age (older than 39 for men, 46 for women), diabetes mellitus, dyslipidemia, hypertension, male gender and sedentary lifestyle   The following portions of the patient's history were reviewed and updated as appropriate: allergies, current medications, past family history, past medical history, past social history and problem list.  ROS as in subjective above    Objective:    Wt 149 lb (67.586 kg)   General appearence: alert, no distress, WD/WN Lab Review Lab Results  Component Value Date   HGBA1C 6.0 09/21/2013   Lab Results  Component Value Date   CHOL 97 12/25/2012   HDL 27* 12/25/2012   LDLCALC 35 12/25/2012   TRIG 177* 12/25/2012   CHOLHDL 3.6 12/25/2012   No results found for: Derl Barrow   Chemistry      Component Value Date/Time   NA 136 05/02/2013 1042   K 4.3 05/02/2013 1042   CL 102 05/02/2013 1042   CO2 27 05/02/2013 1042   BUN 27* 12/25/2012 0956   CREATININE 1.85* 12/25/2012 0956   CREATININE 1.64* 04/03/2008 0615      Component Value Date/Time   CALCIUM 9.2 12/25/2012 0956   ALKPHOS 42 12/25/2012 0956   AST 17 12/25/2012 0956   ALT 13 12/25/2012 0956   BILITOT 0.4 12/25/2012 0956         Chemistry      Component Value Date/Time   NA 136 05/02/2013 1042   K 4.3 05/02/2013 1042   CL 102 05/02/2013 1042   CO2 27 05/02/2013 1042   BUN 27* 12/25/2012 0956   CREATININE 1.85* 12/25/2012 0956   CREATININE 1.64* 04/03/2008 0615      Component Value Date/Time   CALCIUM 9.2 12/25/2012 0956   ALKPHOS 42 12/25/2012 0956   AST 17 12/25/2012 0956   ALT 13 12/25/2012 0956   BILITOT 0.4 12/25/2012 0956    Hemoglobin A1c is 6.3       Assessment:  Need for prophylactic vaccination against Streptococcus pneumoniae (pneumococcus) - Plan: Pneumococcal conjugate vaccine 13-valent  Need for prophylactic vaccination and inoculation against influenza - Plan: Flu vaccine HIGH DOSE PF (Fluzone Tri High dose)  Arthritis  Type 2 diabetes mellitus with diabetic nephropathy - Plan: POCT glycosylated hemoglobin (Hb A1C), Comprehensive metabolic panel, Lipid panel, CBC with Differential, CANCELED: POCT UA - Microalbumin  Hypertension associated with diabetes - Plan: Comprehensive metabolic panel, CBC with Differential  Hyperlipidemia associated with type 2 diabetes mellitus - Plan: Lipid panel  Hypothyroidism, unspecified hypothyroidism type - Plan: TSH  Coronary artery disease, with PCI of the LAD; following an anterior MI treated medically. - Plan: Comprehensive metabolic panel, Lipid panel, CBC with Differential        Plan:    1.  Rx changes: none 2.  Education:  Reviewed 'ABCs' of diabetes management (respective goals in parentheses):  A1C (<7), blood pressure (<130/80), and cholesterol (LDL <100). 3.  Compliance at present is estimated to be good. Efforts to improve compliance (if necessary) will be directed at no change. 4. Follow up: 4 months

## 2014-01-26 LAB — COMPREHENSIVE METABOLIC PANEL
ALT: 11 U/L (ref 0–53)
AST: 12 U/L (ref 0–37)
Albumin: 3.7 g/dL (ref 3.5–5.2)
Alkaline Phosphatase: 46 U/L (ref 39–117)
BILIRUBIN TOTAL: 0.4 mg/dL (ref 0.2–1.2)
BUN: 26 mg/dL — ABNORMAL HIGH (ref 6–23)
CALCIUM: 9 mg/dL (ref 8.4–10.5)
CHLORIDE: 101 meq/L (ref 96–112)
CO2: 24 meq/L (ref 19–32)
Creat: 1.66 mg/dL — ABNORMAL HIGH (ref 0.50–1.35)
Glucose, Bld: 99 mg/dL (ref 70–99)
Potassium: 4.3 mEq/L (ref 3.5–5.3)
Sodium: 134 mEq/L — ABNORMAL LOW (ref 135–145)
Total Protein: 6.2 g/dL (ref 6.0–8.3)

## 2014-01-26 LAB — LIPID PANEL
CHOLESTEROL: 91 mg/dL (ref 0–200)
HDL: 29 mg/dL — ABNORMAL LOW (ref >39)
LDL Cholesterol: 35 mg/dL (ref 0–99)
Total CHOL/HDL Ratio: 3.1 Ratio
Triglycerides: 133 mg/dL (ref <150)
VLDL: 27 mg/dL (ref 0–40)

## 2014-02-05 ENCOUNTER — Other Ambulatory Visit: Payer: Self-pay | Admitting: Family Medicine

## 2014-02-05 NOTE — Telephone Encounter (Signed)
Pt's daughter called and stated that pt needs refills on meds. He needs actos, levothyroxine, folic acid and digoxin sent to [facility] on  [address]

## 2014-03-01 ENCOUNTER — Other Ambulatory Visit: Payer: Self-pay | Admitting: Family Medicine

## 2014-04-04 ENCOUNTER — Other Ambulatory Visit: Payer: Self-pay | Admitting: Family Medicine

## 2014-05-03 ENCOUNTER — Other Ambulatory Visit: Payer: Self-pay | Admitting: Family Medicine

## 2014-05-09 ENCOUNTER — Encounter: Payer: Self-pay | Admitting: Family Medicine

## 2014-05-09 ENCOUNTER — Ambulatory Visit (INDEPENDENT_AMBULATORY_CARE_PROVIDER_SITE_OTHER): Payer: Medicare Other | Admitting: Family Medicine

## 2014-05-09 VITALS — BP 110/60 | HR 84 | Wt 143.0 lb

## 2014-05-09 DIAGNOSIS — E119 Type 2 diabetes mellitus without complications: Secondary | ICD-10-CM

## 2014-05-09 LAB — POCT GLYCOSYLATED HEMOGLOBIN (HGB A1C): HEMOGLOBIN A1C: 6.3

## 2014-05-09 NOTE — Progress Notes (Signed)
  Subjective:    Patient ID: Kyle Parrish, male    DOB: 01-31-1913, 81 y.o.   MRN: [id number]  Kyle Parrish is a 81 y.o. male who presents for follow-up of Type 2 diabetes mellitus.  Home blood sugar records: Patient checks every other day Current symptoms/problem none Daily foot checks:   Any foot concerns: none Exercise: none Eyes : 5 months ago and in March The following portions of the patient's history were reviewed and updated as appropriate: allergies, current medications, past medical history, past social history and problem list.  ROS as in subjective above.     Objective:    Physical Exam Alert and in no distress otherwise not examined.  Lab Review Diabetic Labs Latest Ref Rng 01/25/2014 09/21/2013 05/02/2013 12/25/2012 08/23/2012  HbA1c - 6.3 6.0 6.3% 6.0 6.4  Chol 0 - 200 mg/dL 91 - - 97 -  HDL >39 mg/dL 29(L) - - 27(L) -  Calc LDL 0 - 99 mg/dL 35 - - 35 -  Triglycerides <150 mg/dL 133 - - 177(H) -  Creatinine 0.50 - 1.35 mg/dL 1.66(H) - - 1.85(H) -   BP/Weight 01/25/2014 01/11/2014 09/21/2013 05/02/2013 68/10/8108  Systolic BP 315 945 859 292 446  Diastolic BP 50 56 60 72 70  Wt. (Lbs) 149 149 151 150 153  BMI 22.66 22.66 22.96 22.81 23.27    Con  reports that he has been smoking Cigars.  He has never used smokeless tobacco. He reports that he does not drink alcohol or use illicit drugs. Hemoglobin A1c is 6.3    Assessment & Plan:    Diabetes mellitus without complication - Plan: POCT glycosylated hemoglobin (Hb A1C)   Rx changes: none  Education: Reviewed 'ABCs' of diabetes management (respective goals in parentheses):  A1C (<7), blood pressure (<130/80), and cholesterol (LDL <100).  Compliance at present is estimated to be excellent. Efforts to improve compliance (if necessary) will be directed at Nothing.  Follow up: 6 months   First do no harm!

## 2014-07-02 ENCOUNTER — Encounter: Payer: Self-pay | Admitting: *Deleted

## 2014-07-05 ENCOUNTER — Telehealth: Payer: Self-pay | Admitting: Family Medicine

## 2014-07-05 ENCOUNTER — Other Ambulatory Visit: Payer: Self-pay | Admitting: Family Medicine

## 2014-07-05 MED ORDER — FOLIC ACID 1 MG PO TABS: 1.0000 mg | ORAL_TABLET | Freq: Every day | ORAL | Status: DC

## 2014-07-05 MED ORDER — TAMSULOSIN HCL 0.4 MG PO CAPS: 0.4000 mg | ORAL_CAPSULE | Freq: Every day | ORAL | Status: DC

## 2014-07-05 NOTE — Telephone Encounter (Signed)
Requesting refill on Tamsulosin 0.4mg  and Folic Acid 1mg 

## 2014-08-03 ENCOUNTER — Other Ambulatory Visit: Payer: Self-pay | Admitting: Family Medicine

## 2014-09-03 ENCOUNTER — Telehealth: Payer: Self-pay | Admitting: Family Medicine

## 2014-09-03 NOTE — Telephone Encounter (Signed)
Pt request handicap plackard.

## 2014-09-04 ENCOUNTER — Telehealth: Payer: Self-pay | Admitting: Internal Medicine

## 2014-09-04 NOTE — Telephone Encounter (Signed)
Tried to call pt but no answer. i have put his handicap placard in the mail for him

## 2014-10-14 ENCOUNTER — Other Ambulatory Visit: Payer: Self-pay | Admitting: Medical

## 2014-10-31 ENCOUNTER — Other Ambulatory Visit: Payer: Self-pay | Admitting: Family Medicine

## 2014-11-20 ENCOUNTER — Encounter: Payer: Self-pay | Admitting: Family Medicine

## 2014-11-20 ENCOUNTER — Ambulatory Visit (INDEPENDENT_AMBULATORY_CARE_PROVIDER_SITE_OTHER): Payer: Medicare Other | Admitting: Family Medicine

## 2014-11-20 ENCOUNTER — Ambulatory Visit: Payer: Medicare Other | Admitting: Family Medicine

## 2014-11-20 VITALS — BP 120/60 | HR 50 | Ht 68.0 in | Wt 157.0 lb

## 2014-11-20 DIAGNOSIS — E1159 Type 2 diabetes mellitus with other circulatory complications: Secondary | ICD-10-CM

## 2014-11-20 DIAGNOSIS — E119 Type 2 diabetes mellitus without complications: Secondary | ICD-10-CM

## 2014-11-20 DIAGNOSIS — E1169 Type 2 diabetes mellitus with other specified complication: Secondary | ICD-10-CM

## 2014-11-20 DIAGNOSIS — C44310 Basal cell carcinoma of skin of unspecified parts of face: Secondary | ICD-10-CM | POA: Diagnosis not present

## 2014-11-20 DIAGNOSIS — E1136 Type 2 diabetes mellitus with diabetic cataract: Secondary | ICD-10-CM | POA: Diagnosis not present

## 2014-11-20 DIAGNOSIS — E785 Hyperlipidemia, unspecified: Secondary | ICD-10-CM | POA: Diagnosis not present

## 2014-11-20 DIAGNOSIS — Z23 Encounter for immunization: Secondary | ICD-10-CM | POA: Diagnosis not present

## 2014-11-20 DIAGNOSIS — I10 Essential (primary) hypertension: Secondary | ICD-10-CM | POA: Diagnosis not present

## 2014-11-20 DIAGNOSIS — I152 Hypertension secondary to endocrine disorders: Secondary | ICD-10-CM

## 2014-11-20 DIAGNOSIS — C44300 Unspecified malignant neoplasm of skin of unspecified part of face: Secondary | ICD-10-CM

## 2014-11-20 DIAGNOSIS — M1990 Unspecified osteoarthritis, unspecified site: Secondary | ICD-10-CM

## 2014-11-20 LAB — POCT GLYCOSYLATED HEMOGLOBIN (HGB A1C): Hemoglobin A1C: 6

## 2014-11-20 NOTE — Progress Notes (Signed)
   Subjective:    Patient ID: Kyle Parrish, male    DOB: 06-Jan-1913, 81 y.o.   MRN: [id number]  HPI He is here for follow-up on his diabetes. He continues on Lipitor, Norvasc and metformin. He does not check his sugars regularly. Still smokes a cigar. His activity is quite limited. Although he reads his vision is limited. He does complain of arthritis but is not taking any medications for he continues have difficulty with hearing but does not want to wear hearing aid.He will schedule to see his doctor in the near future. He also has a lesion present on his nose and does plan to see a dermatologist about this.  Review of Systems     Objective:   Physical Exam Alert and in no distress. Lesion noted on the bridge of the nose which is approximately 1-1/2 by one and half centimeters and slightly indented. Hemoglobin A1c is 6.0.       Assessment & Plan:  Diabetes mellitus without complication - Plan: POCT glycosylated hemoglobin (Hb A1C)  Arthritis  Hypertension associated with diabetes  Hyperlipidemia associated with type 2 diabetes mellitus  Need for prophylactic vaccination and inoculation against influenza - Plan: Flu vaccine HIGH DOSE PF (Fluzone High dose)  Skin cancer of face  Type 2 diabetes mellitus with diabetic cataract Follow-up with him in 4 months. He will be 81 years old bilateral. Overall he is doing quite well.

## 2014-11-22 ENCOUNTER — Ambulatory Visit: Payer: Medicare Other | Admitting: Family Medicine

## 2014-12-01 ENCOUNTER — Other Ambulatory Visit: Payer: Self-pay | Admitting: Family Medicine

## 2015-01-02 ENCOUNTER — Other Ambulatory Visit: Payer: Self-pay | Admitting: Family Medicine

## 2015-01-02 NOTE — Telephone Encounter (Signed)
Is this okay?

## 2015-01-14 ENCOUNTER — Telehealth: Payer: Self-pay | Admitting: Family Medicine

## 2015-01-14 MED ORDER — DICLOFENAC SODIUM 1 % TD GEL: TRANSDERMAL | Status: DC

## 2015-01-14 NOTE — Telephone Encounter (Signed)
Corporate treasurer & correcting quantity to 100 gms

## 2015-02-04 ENCOUNTER — Other Ambulatory Visit: Payer: Self-pay

## 2015-02-04 ENCOUNTER — Other Ambulatory Visit: Payer: Self-pay | Admitting: Family Medicine

## 2015-02-04 ENCOUNTER — Telehealth: Payer: Self-pay

## 2015-02-04 MED ORDER — SIMVASTATIN 40 MG PO TABS: 40.0000 mg | ORAL_TABLET | Freq: Every day | ORAL | Status: DC

## 2015-02-04 MED ORDER — DIGOXIN 125 MCG PO TABS: 125.0000 ug | ORAL_TABLET | Freq: Every day | ORAL | Status: DC

## 2015-02-04 MED ORDER — FOLIC ACID 1 MG PO TABS: 1.0000 mg | ORAL_TABLET | Freq: Every day | ORAL | Status: DC

## 2015-02-04 NOTE — Telephone Encounter (Signed)
grandaughter informed and verbalized understanding

## 2015-02-04 NOTE — Telephone Encounter (Signed)
Call the meds in except the eyedrops. Let her know that they need to come from the eye doctor

## 2015-02-04 NOTE — Telephone Encounter (Signed)
Pt Granddaughter called saying he needed these meds refilled: Simvastatin 40mg  [id number], Folic Acid 1mg  #90, Digoxin 0.125mg  #30, Latanoprost 0.005% ophthalmic solution, and Dorzolamide HCI-Timolol Mal PF 22.3-6.8mg /ml SOLN. They would like these done before the holiday.  Pt uses the [facility] on [address]

## 2015-03-28 ENCOUNTER — Ambulatory Visit: Payer: Self-pay | Admitting: Family Medicine

## 2015-04-08 ENCOUNTER — Encounter: Payer: Self-pay | Admitting: Family Medicine

## 2015-04-08 ENCOUNTER — Ambulatory Visit (INDEPENDENT_AMBULATORY_CARE_PROVIDER_SITE_OTHER): Payer: Medicare Other | Admitting: Family Medicine

## 2015-04-08 VITALS — BP 116/62 | HR 58 | Ht 66.0 in | Wt 144.0 lb

## 2015-04-08 DIAGNOSIS — E039 Hypothyroidism, unspecified: Secondary | ICD-10-CM | POA: Diagnosis not present

## 2015-04-08 DIAGNOSIS — R634 Abnormal weight loss: Secondary | ICD-10-CM

## 2015-04-08 DIAGNOSIS — I255 Ischemic cardiomyopathy: Secondary | ICD-10-CM

## 2015-04-08 DIAGNOSIS — I2510 Atherosclerotic heart disease of native coronary artery without angina pectoris: Secondary | ICD-10-CM

## 2015-04-08 DIAGNOSIS — I10 Essential (primary) hypertension: Secondary | ICD-10-CM | POA: Diagnosis not present

## 2015-04-08 DIAGNOSIS — E785 Hyperlipidemia, unspecified: Secondary | ICD-10-CM

## 2015-04-08 DIAGNOSIS — E1169 Type 2 diabetes mellitus with other specified complication: Secondary | ICD-10-CM | POA: Diagnosis not present

## 2015-04-08 DIAGNOSIS — E1159 Type 2 diabetes mellitus with other circulatory complications: Secondary | ICD-10-CM | POA: Diagnosis not present

## 2015-04-08 DIAGNOSIS — M1990 Unspecified osteoarthritis, unspecified site: Secondary | ICD-10-CM

## 2015-04-08 DIAGNOSIS — E118 Type 2 diabetes mellitus with unspecified complications: Secondary | ICD-10-CM

## 2015-04-08 LAB — CBC WITH DIFFERENTIAL/PLATELET
BASOS ABS: 0.1 10*3/uL (ref 0.0–0.1)
Basophils Relative: 1 % (ref 0–1)
EOS ABS: 0.6 10*3/uL (ref 0.0–0.7)
Eosinophils Relative: 7 % — ABNORMAL HIGH (ref 0–5)
HCT: 29.3 % — ABNORMAL LOW (ref 39.0–52.0)
Hemoglobin: 9.4 g/dL — ABNORMAL LOW (ref 13.0–17.0)
LYMPHS ABS: 2.3 10*3/uL (ref 0.7–4.0)
Lymphocytes Relative: 28 % (ref 12–46)
MCH: 30.8 pg (ref 26.0–34.0)
MCHC: 32.1 g/dL (ref 30.0–36.0)
MCV: 96.1 fL (ref 78.0–100.0)
MPV: 11.4 fL (ref 8.6–12.4)
Monocytes Absolute: 0.7 10*3/uL (ref 0.1–1.0)
Monocytes Relative: 9 % (ref 3–12)
NEUTROS PCT: 55 % (ref 43–77)
Neutro Abs: 4.5 10*3/uL (ref 1.7–7.7)
PLATELETS: 138 10*3/uL — AB (ref 150–400)
RBC: 3.05 MIL/uL — AB (ref 4.22–5.81)
RDW: 14.5 % (ref 11.5–15.5)
WBC: 8.1 10*3/uL (ref 4.0–10.5)

## 2015-04-08 LAB — COMPREHENSIVE METABOLIC PANEL
ALK PHOS: 39 U/L — AB (ref 40–115)
ALT: 11 U/L (ref 9–46)
AST: 14 U/L (ref 10–35)
Albumin: 3.4 g/dL — ABNORMAL LOW (ref 3.6–5.1)
BILIRUBIN TOTAL: 0.4 mg/dL (ref 0.2–1.2)
BUN: 24 mg/dL (ref 7–25)
CO2: 28 mmol/L (ref 20–31)
CREATININE: 1.64 mg/dL — AB (ref 0.70–1.11)
Calcium: 8.9 mg/dL (ref 8.6–10.3)
Chloride: 101 mmol/L (ref 98–110)
GLUCOSE: 123 mg/dL — AB (ref 65–99)
POTASSIUM: 4.4 mmol/L (ref 3.5–5.3)
SODIUM: 132 mmol/L — AB (ref 135–146)
TOTAL PROTEIN: 5.8 g/dL — AB (ref 6.1–8.1)

## 2015-04-08 LAB — TSH: TSH: 1.068 u[IU]/mL (ref 0.350–4.500)

## 2015-04-08 LAB — LIPID PANEL
CHOLESTEROL: 97 mg/dL — AB (ref 125–200)
HDL: 23 mg/dL — ABNORMAL LOW (ref >=40)
LDL Cholesterol: 52 mg/dL (ref <130)
Total CHOL/HDL Ratio: 4.2 Ratio (ref <=5.0)
Triglycerides: 111 mg/dL (ref <150)
VLDL: 22 mg/dL (ref <30)

## 2015-04-08 LAB — POCT GLYCOSYLATED HEMOGLOBIN (HGB A1C): HEMOGLOBIN A1C: 5.8

## 2015-04-08 NOTE — Progress Notes (Signed)
   Subjective:    Patient ID: Kyle Parrish, male    DOB: 13-Mar-1913, 82 y.o.   MRN: [id number]  HPI He is here for a recheck. He really has no complaints but does admit to not being hungry and therefore not eating very much. He has had no nausea, vomiting, abdominal pain, early satiety. He still does have arthritis but is not very physically active.He does ambulate but is now starting to have some difficulties with this. He continues on the medications listed in the chart for his hypertension, hyperlipidemia, thyroid issues. He does have a history of coronary artery disease as well as an ischemic cardiomyopathy and continues on Lanoxin.He is hard of hearing and does have a hearing aid present on the left.   Review of Systems     Objective:   Physical Exam Alert and in no distress. Tympanic membranes and canals are normal. Pharyngeal area is normal. Neck is supple without adenopathy or thyromegaly. Cardiac exam shows a regular sinus rhythm without murmurs or gallops. Lungs are clear to auscultation., Exam shows no masses or tenderness with normal bowel sounds Hemoglobin A1c is 5.8       Assessment & Plan:  Type 2 diabetes mellitus with complication, without long-term current use of insulin (HCC) - Plan: POCT glycosylated hemoglobin (Hb A1C), CANCELED: POCT UA - Microalbumin  Arthritis  Hypertension associated with diabetes ([place]) - Plan: CBC with Differential/Platelet, Comprehensive metabolic panel  Hyperlipidemia associated with type 2 diabetes mellitus ([place]) - Plan: Lipid panel  Hypothyroidism, unspecified hypothyroidism type - Plan: TSH  Coronary artery disease, with PCI of the LAD; following an anterior MI treated medically. - Plan: Digoxin level  History of Ischemic cardiomyopathy - Plan: Digoxin level  Loss of weight - Plan: Digoxin level, POCT glycosylated hemoglobin (Hb A1C) I will do routine blood screening on him and make sure that he has a normal digoxin level. The  weight loss and anorexia could easily just be age finally catching up with him since he is now 82

## 2015-04-09 LAB — DIGOXIN LEVEL: DIGOXIN LVL: 0.9 ug/L (ref 0.8–2.0)

## 2015-04-11 ENCOUNTER — Other Ambulatory Visit: Payer: Self-pay | Admitting: Family Medicine

## 2015-04-11 ENCOUNTER — Telehealth: Payer: Self-pay | Admitting: Family Medicine

## 2015-04-11 NOTE — Telephone Encounter (Signed)
Answering service called & states Granddaughter called & states pt's stomach medication has expired.   I called Granddaughter & pt needed refill omeprazole, advised filled today. Called [facility] & it has been filled

## 2015-05-03 ENCOUNTER — Other Ambulatory Visit: Payer: Self-pay | Admitting: Family Medicine

## 2015-06-05 ENCOUNTER — Other Ambulatory Visit: Payer: Self-pay | Admitting: Family Medicine

## 2015-06-05 NOTE — Telephone Encounter (Signed)
Pt's granddaughter called and stated that pt needs refills on Carvedilol and Levothyroxine sent to [facility] on [address]. She can be reached at [phone].

## 2015-07-11 ENCOUNTER — Other Ambulatory Visit: Payer: Self-pay | Admitting: Family Medicine

## 2015-07-18 ENCOUNTER — Telehealth: Payer: Self-pay | Admitting: Family Medicine

## 2015-07-18 ENCOUNTER — Ambulatory Visit (INDEPENDENT_AMBULATORY_CARE_PROVIDER_SITE_OTHER): Payer: Medicare Other | Admitting: Family Medicine

## 2015-07-18 ENCOUNTER — Encounter: Payer: Self-pay | Admitting: Family Medicine

## 2015-07-18 VITALS — BP 90/40 | HR 63 | Wt 130.0 lb

## 2015-07-18 DIAGNOSIS — I255 Ischemic cardiomyopathy: Secondary | ICD-10-CM | POA: Diagnosis not present

## 2015-07-18 DIAGNOSIS — Z9181 History of falling: Secondary | ICD-10-CM

## 2015-07-18 DIAGNOSIS — E039 Hypothyroidism, unspecified: Secondary | ICD-10-CM

## 2015-07-18 DIAGNOSIS — R9431 Abnormal electrocardiogram [ECG] [EKG]: Secondary | ICD-10-CM

## 2015-07-18 DIAGNOSIS — I2510 Atherosclerotic heart disease of native coronary artery without angina pectoris: Secondary | ICD-10-CM

## 2015-07-18 DIAGNOSIS — I952 Hypotension due to drugs: Secondary | ICD-10-CM

## 2015-07-18 LAB — COMPREHENSIVE METABOLIC PANEL
ALK PHOS: 117 U/L — AB (ref 40–115)
ALT: 34 U/L (ref 9–46)
AST: 56 U/L — AB (ref 10–35)
Albumin: 3.4 g/dL — ABNORMAL LOW (ref 3.6–5.1)
BILIRUBIN TOTAL: 1.1 mg/dL (ref 0.2–1.2)
BUN: 32 mg/dL — AB (ref 7–25)
CALCIUM: 9.3 mg/dL (ref 8.6–10.3)
CO2: 24 mmol/L (ref 20–31)
Chloride: 87 mmol/L — ABNORMAL LOW (ref 98–110)
Creat: 2.26 mg/dL — ABNORMAL HIGH (ref 0.70–1.11)
GLUCOSE: 324 mg/dL — AB (ref 65–99)
Potassium: 5.7 mmol/L — ABNORMAL HIGH (ref 3.5–5.3)
SODIUM: 123 mmol/L — AB (ref 135–146)
Total Protein: 5.7 g/dL — ABNORMAL LOW (ref 6.1–8.1)

## 2015-07-18 LAB — CBC WITH DIFFERENTIAL/PLATELET
BASOS PCT: 0 %
Basophils Absolute: 0 cells/uL (ref 0–200)
EOS PCT: 0 %
Eosinophils Absolute: 0 cells/uL — ABNORMAL LOW (ref 15–500)
HEMATOCRIT: 32.7 % — AB (ref 38.5–50.0)
Hemoglobin: 10.6 g/dL — ABNORMAL LOW (ref 13.2–17.1)
LYMPHS PCT: 13 %
Lymphs Abs: 1521 cells/uL (ref 850–3900)
MCH: 30.7 pg (ref 27.0–33.0)
MCHC: 32.4 g/dL (ref 32.0–36.0)
MCV: 94.8 fL (ref 80.0–100.0)
MONO ABS: 936 {cells}/uL (ref 200–950)
MONOS PCT: 8 %
MPV: 12.7 fL — AB (ref 7.5–12.5)
NEUTROS PCT: 79 %
Neutro Abs: 9243 cells/uL — ABNORMAL HIGH (ref 1500–7800)
PLATELETS: 134 10*3/uL — AB (ref 140–400)
RBC: 3.45 MIL/uL — AB (ref 4.20–5.80)
RDW: 14.1 % (ref 11.0–15.0)
WBC: 11.7 10*3/uL — AB (ref 4.0–10.5)

## 2015-07-18 NOTE — Patient Instructions (Signed)
Hold both the Coreg and the Lanoxin. Linear fluids

## 2015-07-18 NOTE — Telephone Encounter (Signed)
Right now might be a good idea for him to be monitored better

## 2015-07-18 NOTE — Progress Notes (Signed)
   Subjective:    Patient ID: Kyle Parrish, male    DOB: [birth year], 82 y.o.   MRN: [id number]  HPI He is here for consult concerning difficulty with shortness of breath and fatigue for the last week. He is also noted some swelling in his lower extremity. No chest pain, PND. Apparently yesterday he fell. His daughter-in-law is also noted slight slurring of speech. He has an underlying history of hypothyroidism as well as ischemic cardiomyopathy.continues on medications listed in the chart.   Review of Systems     Objective:   Physical Exam Alert and in no distress with questionable slight slurring of speech. Cardiac exam does show heart rate slightly under 70. No murmurs or gallops. Lungs are clear to auscultation. Lower extremities does show 2+ pitting edema. EKG does show some lateral ischemia.       Assessment & Plan:  Hypotension due to drugs - Plan: CBC with Differential/Platelet, Comprehensive metabolic panel, Digoxin level, EKG 12-Lead  Hypothyroidism, unspecified hypothyroidism type - Plan: TSH  History of Ischemic cardiomyopathy - Plan: CBC with Differential/Platelet, Comprehensive metabolic panel, Digoxin level, EKG 12-Lead  Nonspecific abnormal electrocardiogram (ECG) (EKG)  History of recent fall I will hold the Coreg and digoxin and see what the blood studies including digoxin level show. It looks as if he could easily have had a heart attack approximately one week ago however at this point did not feel any major intervention is necessary. We'll reevaluate after the blood work is back. He and his daughter-in-law are comfortable with this. I also encouraged him to give him plenty of fluids and supplement his diet is much as he will tolerate.

## 2015-07-18 NOTE — Telephone Encounter (Signed)
Pt's daughter forgot to ask you a question when they were seeing you today. Patty wanted to ask if pt needed to be staying alone. He currently live in an apartment alone. Please advise. Call Patty at [phone].

## 2015-07-18 NOTE — Telephone Encounter (Signed)
Kyle Parrish informed and verbalized understanding

## 2015-07-19 LAB — DIGOXIN LEVEL: Digoxin Level: 2.3 ug/L — ABNORMAL HIGH (ref 0.8–2.0)

## 2015-07-19 LAB — TSH: TSH: 1.97 mIU/L (ref 0.40–4.50)

## 2015-07-23 ENCOUNTER — Encounter: Payer: Self-pay | Admitting: Family Medicine

## 2015-07-23 ENCOUNTER — Telehealth: Payer: Self-pay

## 2015-07-23 NOTE — Telephone Encounter (Signed)
Pt will be here monday

## 2015-07-23 NOTE — Telephone Encounter (Signed)
Left message for Patty to call me back we need to know when they are [place] the beach so we can get him in because his sugars are high 424 when he got his labs done at the beach

## 2015-07-28 ENCOUNTER — Encounter: Payer: Self-pay | Admitting: Family Medicine

## 2015-07-28 ENCOUNTER — Ambulatory Visit (INDEPENDENT_AMBULATORY_CARE_PROVIDER_SITE_OTHER): Payer: Medicare Other | Admitting: Family Medicine

## 2015-07-28 VITALS — BP 90/50 | HR 74 | Wt 131.0 lb

## 2015-07-28 DIAGNOSIS — E1121 Type 2 diabetes mellitus with diabetic nephropathy: Secondary | ICD-10-CM

## 2015-07-28 DIAGNOSIS — N289 Disorder of kidney and ureter, unspecified: Secondary | ICD-10-CM | POA: Diagnosis not present

## 2015-07-28 DIAGNOSIS — I2510 Atherosclerotic heart disease of native coronary artery without angina pectoris: Secondary | ICD-10-CM

## 2015-07-28 DIAGNOSIS — I952 Hypotension due to drugs: Secondary | ICD-10-CM

## 2015-07-28 MED ORDER — METFORMIN HCL 500 MG PO TABS: 500.0000 mg | ORAL_TABLET | Freq: Two times a day (BID) | ORAL | Status: DC

## 2015-07-28 NOTE — Progress Notes (Signed)
   Subjective:    Patient ID: Kyle Parrish, male    DOB: [birth year], 82 y.o.   MRN: [id number]  HPI  he is here for a recheck. He did have recent difficulty with electrolyte abnormalities as well as evidence of worsening renal function. His blood sugar also has started to creep up precipitously. Recent blood work did show a blood sugar greater than 400 range. Also his sodium was below 130. Presently he apparently is eating more and doing much better having very little difficulty. He does live alone but does have relatives check on him on a regular basis. Presently he is on pioglitazone.   Review of Systems     Objective:   Physical Exam  alert and in no distress otherwise not examined. Blood sugar and hemoglobin A1c are recorded and are elevated.  1C is 8.7      Assessment & Plan:  Hypotension due to drugs  Type 2 diabetes mellitus with diabetic nephropathy, without long-term current use of insulin (HCC) - Plan: metFORMIN (GLUCOPHAGE) 500 MG tablet  Renal insufficiency  I will start him on metformin and also give insulin 10 units daily for the next several days. His daughter will check his blood sugar before and after meals. He is to return here on Friday for a recheck. We will also get home health involved  To determine what his actual needs are.Marland Kitchen He is quite hard of hearing and the daughter plans to take him to the [place] to see about getting hearing aids.

## 2015-07-28 NOTE — Patient Instructions (Signed)
Check the blood sugars either before a meal or 2 hours after a meal. 10 units daily. Recheck here on friday

## 2015-07-30 ENCOUNTER — Telehealth: Payer: Self-pay | Admitting: Family Medicine

## 2015-07-30 ENCOUNTER — Telehealth: Payer: Self-pay

## 2015-07-30 NOTE — Telephone Encounter (Signed)
Son in law called Charlsie Merles states pt in pain with knees and wanted to know what he could take for pain. Called daughter who is on HIPPA & she states she hasn't retaken his BS yet but pt having swelling in foot and hand. Called Dr. Redmond School & pt can take Tylenol as bottle directed  Called Daughter & informed

## 2015-07-30 NOTE — Telephone Encounter (Signed)
Grand daughter Belenda Cruise called with update states pt's BP 142/68 pulse 56, Blood Sugar 57 and he is really tired.  Yesterday it was 401 3 hours after he'd eaten cheerios, water, OJ & coffee.  She is on HIPPA.  She needs to know ASAP what to do regarding insulin

## 2015-07-30 NOTE — Telephone Encounter (Signed)
Blood sugars 57 this am.

## 2015-07-30 NOTE — Telephone Encounter (Signed)
Called Dr. Redmond School and gave him update on pt and he states to immediately give him orange juice and eat.  No insulin today and plenty of fluids.  Recheck BS in couple hours and call back if needed.  Called pt's grand daughter and gave her instructions.

## 2015-07-30 NOTE — Telephone Encounter (Signed)
Duplicate note

## 2015-07-31 ENCOUNTER — Telehealth: Payer: Self-pay

## 2015-07-31 ENCOUNTER — Telehealth: Payer: Self-pay | Admitting: Family Medicine

## 2015-07-31 NOTE — Telephone Encounter (Signed)
Called Kyle Parrish to see where his sugar was this morning Kyle Parrish stated he doesn't get up till 11 am they will call back and let us know what it is

## 2015-07-31 NOTE — Telephone Encounter (Signed)
Patty informed and verbalized understanding

## 2015-07-31 NOTE — Telephone Encounter (Signed)
Pt's daughter, Chong Sicilian, called stating that pt's sugar level was 344 this morning at 11:00 before any meds. Also she wanted Dr Redmond School to know that due to pt having an issue with diarrhea, pharmacist advised that pt take 1 Metformin a day instead of 2 a day. Pt is not having diarrhea now. Please let them know if he should continue with 1 Metformin a day. Call Patty at [phone]

## 2015-07-31 NOTE — Telephone Encounter (Signed)
1 metformin per day is fine. I think am scheduled to see him tomorrow

## 2015-08-01 ENCOUNTER — Ambulatory Visit (INDEPENDENT_AMBULATORY_CARE_PROVIDER_SITE_OTHER): Payer: Medicare Other | Admitting: Family Medicine

## 2015-08-01 ENCOUNTER — Encounter: Payer: Self-pay | Admitting: Family Medicine

## 2015-08-01 ENCOUNTER — Telehealth: Payer: Self-pay | Admitting: Family Medicine

## 2015-08-01 VITALS — BP 94/50 | HR 94 | Wt 130.0 lb

## 2015-08-01 DIAGNOSIS — I2510 Atherosclerotic heart disease of native coronary artery without angina pectoris: Secondary | ICD-10-CM

## 2015-08-01 DIAGNOSIS — I952 Hypotension due to drugs: Secondary | ICD-10-CM | POA: Diagnosis not present

## 2015-08-01 DIAGNOSIS — N289 Disorder of kidney and ureter, unspecified: Secondary | ICD-10-CM

## 2015-08-01 DIAGNOSIS — E1121 Type 2 diabetes mellitus with diabetic nephropathy: Secondary | ICD-10-CM | POA: Diagnosis not present

## 2015-08-01 DIAGNOSIS — E871 Hypo-osmolality and hyponatremia: Secondary | ICD-10-CM

## 2015-08-01 LAB — BASIC METABOLIC PANEL
BUN: 47 mg/dL — AB (ref 7–25)
CO2: 22 mmol/L (ref 20–31)
CREATININE: 2.45 mg/dL — AB (ref 0.70–1.11)
Calcium: 8.6 mg/dL (ref 8.6–10.3)
Chloride: 90 mmol/L — ABNORMAL LOW (ref 98–110)
Glucose, Bld: 383 mg/dL — ABNORMAL HIGH (ref 65–99)
Potassium: 5.1 mmol/L (ref 3.5–5.3)
Sodium: 125 mmol/L — ABNORMAL LOW (ref 135–146)

## 2015-08-01 NOTE — Telephone Encounter (Signed)
Daughter forgot to mention in appt today that pt is out of insulin in the starter pack that they were given so he need a script for insulin sent to pharmacy

## 2015-08-01 NOTE — Progress Notes (Signed)
   Subjective:    Patient ID: RAYNARD BIEGER, male    DOB: 10/29/1912, 82 y.o.   MRN: [id number]  HPI  he is here for recheck. His blood sugars have been fluctuating between below 60 and in the low 400 range. His family has been very supportive however his eating habits I now starting to catch up with him. He is very sporadically and does have relatively high carbohydrate load.   Presently he is taking Actos and metformin. His Lanoxin and Coreg have been held.   Review of Systems     Objective:   Physical Exam  alert and in no distress otherwise not examined       Assessment & Plan:  Hypotension due to drugs - Plan: Basic Metabolic Panel  Type 2 diabetes mellitus with diabetic nephropathy, without long-term current use of insulin (HCC) - Plan: Basic Metabolic Panel  Renal insufficiency - Plan: Basic Metabolic Panel  Hyponatremia - Plan: Basic Metabolic Panel  I have discussed his care with his daughter-in-law. Discussed having home health agency, which they will do tomorrow. Also encouraged him to look into assisted living. Explained that the main issue is having him eat more regular meals and that they're well balanced. Recommend at least 3 meals per day if not 5. Also discussed getting Meals on Wheels. I will decrease his insulin to 6 units. They will keep track of his blood sugars. Prescription written for glucometer strips and lancets.

## 2015-08-04 ENCOUNTER — Other Ambulatory Visit: Payer: Self-pay

## 2015-08-04 ENCOUNTER — Telehealth: Payer: Self-pay

## 2015-08-04 MED ORDER — PEN NEEDLES 32G X 4 MM MISC: 1.0000 | Freq: Every day | Status: DC

## 2015-08-04 NOTE — Telephone Encounter (Signed)
Spoke with daughter- she reports that [place] called her Friday to reschedule [facility] assessment for Sat, then they called Sat and said it would be Sunday. Precious Bard never heard from anyone yesterday and no one ever came to the home. She states she spoke with [place] at [place]. She does not have a return call number to [facility], so she wanted to see if you could check into it. Thanks, RLB

## 2015-08-04 NOTE — Telephone Encounter (Signed)
gentiva and patty have already gotten it all worked out

## 2015-08-05 ENCOUNTER — Telehealth: Payer: Self-pay | Admitting: Internal Medicine

## 2015-08-05 ENCOUNTER — Other Ambulatory Visit: Payer: Self-pay

## 2015-08-05 MED ORDER — INSULIN GLARGINE 300 UNIT/ML ~~LOC~~ SOPN: 6.0000 [IU] | PEN_INJECTOR | Freq: Every day | SUBCUTANEOUS | Status: DC

## 2015-08-05 NOTE — Telephone Encounter (Signed)
Take care of this 

## 2015-08-05 NOTE — Telephone Encounter (Signed)
Pt's daughter called stating that she needs a rx sent to the pharmacy for toujeo as the sample she has is expired. Please send rx to [facility] [address]

## 2015-08-05 NOTE — Telephone Encounter (Signed)
Sent in

## 2015-08-06 ENCOUNTER — Other Ambulatory Visit: Payer: Self-pay

## 2015-08-06 ENCOUNTER — Other Ambulatory Visit: Payer: Self-pay | Admitting: Family Medicine

## 2015-08-06 ENCOUNTER — Telehealth: Payer: Self-pay | Admitting: Family Medicine

## 2015-08-06 MED ORDER — METFORMIN HCL ER 500 MG PO TB24: 500.0000 mg | ORAL_TABLET | Freq: Every day | ORAL | Status: DC

## 2015-08-06 NOTE — Telephone Encounter (Signed)
Go ahead and make that change.

## 2015-08-06 NOTE — Telephone Encounter (Signed)
Lets have him continue on his present dosing and make sure that he is drinking plenty of fluids

## 2015-08-06 NOTE — Telephone Encounter (Signed)
done

## 2015-08-06 NOTE — Telephone Encounter (Signed)
Patty, pt's daughter, called stating that pt's blood sugar was 169 this morning so she wanted Dr Redmond School to know that blood sugar came down

## 2015-08-06 NOTE — Telephone Encounter (Signed)
Kyle Parrish has been informed and wanted know if you could change Metformin to extended release he is still having problem with diarrhea

## 2015-08-08 ENCOUNTER — Telehealth: Payer: Self-pay | Admitting: Family Medicine

## 2015-08-08 NOTE — Telephone Encounter (Signed)
Therapist, Andrey Campanile, called to confirm orders for physical therapy

## 2015-08-09 ENCOUNTER — Inpatient Hospital Stay (HOSPITAL_COMMUNITY): Payer: Medicare Other

## 2015-08-09 ENCOUNTER — Emergency Department (HOSPITAL_COMMUNITY): Payer: Medicare Other

## 2015-08-09 ENCOUNTER — Inpatient Hospital Stay (HOSPITAL_COMMUNITY)
Admission: EM | Admit: 2015-08-09 | Discharge: 2015-08-13 | DRG: 638 | Disposition: A | Discharge status: Skilled Nursing Facility | Payer: Medicare Other | Attending: Internal Medicine | Admitting: Internal Medicine

## 2015-08-09 ENCOUNTER — Encounter (HOSPITAL_COMMUNITY): Payer: Self-pay

## 2015-08-09 DIAGNOSIS — D631 Anemia in chronic kidney disease: Secondary | ICD-10-CM | POA: Diagnosis present

## 2015-08-09 DIAGNOSIS — M109 Gout, unspecified: Secondary | ICD-10-CM | POA: Diagnosis present

## 2015-08-09 DIAGNOSIS — H9190 Unspecified hearing loss, unspecified ear: Secondary | ICD-10-CM | POA: Diagnosis present

## 2015-08-09 DIAGNOSIS — R7989 Other specified abnormal findings of blood chemistry: Secondary | ICD-10-CM | POA: Diagnosis present

## 2015-08-09 DIAGNOSIS — E861 Hypovolemia: Secondary | ICD-10-CM | POA: Diagnosis present

## 2015-08-09 DIAGNOSIS — E118 Type 2 diabetes mellitus with unspecified complications: Secondary | ICD-10-CM | POA: Diagnosis present

## 2015-08-09 DIAGNOSIS — F17290 Nicotine dependence, other tobacco product, uncomplicated: Secondary | ICD-10-CM | POA: Diagnosis present

## 2015-08-09 DIAGNOSIS — E039 Hypothyroidism, unspecified: Secondary | ICD-10-CM | POA: Diagnosis present

## 2015-08-09 DIAGNOSIS — J189 Pneumonia, unspecified organism: Secondary | ICD-10-CM

## 2015-08-09 DIAGNOSIS — N400 Enlarged prostate without lower urinary tract symptoms: Secondary | ICD-10-CM | POA: Diagnosis present

## 2015-08-09 DIAGNOSIS — L570 Actinic keratosis: Secondary | ICD-10-CM | POA: Diagnosis present

## 2015-08-09 DIAGNOSIS — Z794 Long term (current) use of insulin: Secondary | ICD-10-CM

## 2015-08-09 DIAGNOSIS — Z66 Do not resuscitate: Secondary | ICD-10-CM | POA: Diagnosis present

## 2015-08-09 DIAGNOSIS — R9431 Abnormal electrocardiogram [ECG] [EKG]: Secondary | ICD-10-CM

## 2015-08-09 DIAGNOSIS — E871 Hypo-osmolality and hyponatremia: Secondary | ICD-10-CM | POA: Diagnosis present

## 2015-08-09 DIAGNOSIS — I10 Essential (primary) hypertension: Secondary | ICD-10-CM

## 2015-08-09 DIAGNOSIS — I130 Hypertensive heart and chronic kidney disease with heart failure and stage 1 through stage 4 chronic kidney disease, or unspecified chronic kidney disease: Secondary | ICD-10-CM | POA: Diagnosis present

## 2015-08-09 DIAGNOSIS — N179 Acute kidney failure, unspecified: Secondary | ICD-10-CM | POA: Diagnosis present

## 2015-08-09 DIAGNOSIS — E875 Hyperkalemia: Secondary | ICD-10-CM | POA: Diagnosis present

## 2015-08-09 DIAGNOSIS — E162 Hypoglycemia, unspecified: Secondary | ICD-10-CM | POA: Diagnosis present

## 2015-08-09 DIAGNOSIS — N183 Chronic kidney disease, stage 3 (moderate): Secondary | ICD-10-CM | POA: Diagnosis present

## 2015-08-09 DIAGNOSIS — Z85038 Personal history of other malignant neoplasm of large intestine: Secondary | ICD-10-CM | POA: Diagnosis not present

## 2015-08-09 DIAGNOSIS — D696 Thrombocytopenia, unspecified: Secondary | ICD-10-CM | POA: Diagnosis present

## 2015-08-09 DIAGNOSIS — I2510 Atherosclerotic heart disease of native coronary artery without angina pectoris: Secondary | ICD-10-CM | POA: Diagnosis present

## 2015-08-09 DIAGNOSIS — I4891 Unspecified atrial fibrillation: Secondary | ICD-10-CM | POA: Diagnosis present

## 2015-08-09 DIAGNOSIS — K219 Gastro-esophageal reflux disease without esophagitis: Secondary | ICD-10-CM | POA: Diagnosis present

## 2015-08-09 DIAGNOSIS — E1122 Type 2 diabetes mellitus with diabetic chronic kidney disease: Secondary | ICD-10-CM | POA: Diagnosis present

## 2015-08-09 DIAGNOSIS — E1169 Type 2 diabetes mellitus with other specified complication: Secondary | ICD-10-CM | POA: Diagnosis present

## 2015-08-09 DIAGNOSIS — Z7982 Long term (current) use of aspirin: Secondary | ICD-10-CM | POA: Diagnosis not present

## 2015-08-09 DIAGNOSIS — K7460 Unspecified cirrhosis of liver: Secondary | ICD-10-CM | POA: Diagnosis present

## 2015-08-09 DIAGNOSIS — E1159 Type 2 diabetes mellitus with other circulatory complications: Secondary | ICD-10-CM | POA: Diagnosis present

## 2015-08-09 DIAGNOSIS — E1165 Type 2 diabetes mellitus with hyperglycemia: Secondary | ICD-10-CM | POA: Diagnosis present

## 2015-08-09 DIAGNOSIS — I255 Ischemic cardiomyopathy: Secondary | ICD-10-CM | POA: Diagnosis present

## 2015-08-09 DIAGNOSIS — Z85828 Personal history of other malignant neoplasm of skin: Secondary | ICD-10-CM | POA: Diagnosis not present

## 2015-08-09 DIAGNOSIS — E785 Hyperlipidemia, unspecified: Secondary | ICD-10-CM | POA: Diagnosis present

## 2015-08-09 DIAGNOSIS — E860 Dehydration: Secondary | ICD-10-CM | POA: Diagnosis present

## 2015-08-09 DIAGNOSIS — J9811 Atelectasis: Secondary | ICD-10-CM | POA: Diagnosis present

## 2015-08-09 DIAGNOSIS — R778 Other specified abnormalities of plasma proteins: Secondary | ICD-10-CM | POA: Diagnosis present

## 2015-08-09 DIAGNOSIS — Z9861 Coronary angioplasty status: Secondary | ICD-10-CM | POA: Diagnosis not present

## 2015-08-09 DIAGNOSIS — I252 Old myocardial infarction: Secondary | ICD-10-CM | POA: Diagnosis not present

## 2015-08-09 DIAGNOSIS — I152 Hypertension secondary to endocrine disorders: Secondary | ICD-10-CM | POA: Diagnosis present

## 2015-08-09 DIAGNOSIS — N170 Acute kidney failure with tubular necrosis: Secondary | ICD-10-CM | POA: Diagnosis present

## 2015-08-09 DIAGNOSIS — K7469 Other cirrhosis of liver: Secondary | ICD-10-CM | POA: Diagnosis not present

## 2015-08-09 DIAGNOSIS — E11649 Type 2 diabetes mellitus with hypoglycemia without coma: Secondary | ICD-10-CM | POA: Diagnosis not present

## 2015-08-09 DIAGNOSIS — I959 Hypotension, unspecified: Secondary | ICD-10-CM | POA: Diagnosis present

## 2015-08-09 LAB — COMPREHENSIVE METABOLIC PANEL
ALBUMIN: 2.4 g/dL — AB (ref 3.5–5.0)
ALT: 39 U/L (ref 17–63)
ANION GAP: 10 (ref 5–15)
AST: 99 U/L — ABNORMAL HIGH (ref 15–41)
Alkaline Phosphatase: 301 U/L — ABNORMAL HIGH (ref 38–126)
BUN: 70 mg/dL — ABNORMAL HIGH (ref 6–20)
CALCIUM: 9.2 mg/dL (ref 8.9–10.3)
CO2: 22 mmol/L (ref 22–32)
Chloride: 99 mmol/L — ABNORMAL LOW (ref 101–111)
Creatinine, Ser: 3.43 mg/dL — ABNORMAL HIGH (ref 0.61–1.24)
GFR calc non Af Amer: 13 mL/min — ABNORMAL LOW (ref >60)
GFR, EST AFRICAN AMERICAN: 16 mL/min — AB (ref >60)
GLUCOSE: 132 mg/dL — AB (ref 65–99)
POTASSIUM: 6 mmol/L — AB (ref 3.5–5.1)
SODIUM: 131 mmol/L — AB (ref 135–145)
TOTAL PROTEIN: 5.5 g/dL — AB (ref 6.5–8.1)
Total Bilirubin: 2.1 mg/dL — ABNORMAL HIGH (ref 0.3–1.2)

## 2015-08-09 LAB — I-STAT TROPONIN, ED: TROPONIN I, POC: 0.18 ng/mL — AB (ref 0.00–0.08)

## 2015-08-09 LAB — GLUCOSE, CAPILLARY: GLUCOSE-CAPILLARY: 200 mg/dL — AB (ref 65–99)

## 2015-08-09 LAB — CBC WITH DIFFERENTIAL/PLATELET
Basophils Absolute: 0 10*3/uL (ref 0.0–0.1)
Basophils Relative: 0 %
EOS PCT: 0 %
Eosinophils Absolute: 0 10*3/uL (ref 0.0–0.7)
HEMATOCRIT: 32.2 % — AB (ref 39.0–52.0)
HEMOGLOBIN: 10.8 g/dL — AB (ref 13.0–17.0)
Lymphocytes Relative: 11 %
Lymphs Abs: 1 10*3/uL (ref 0.7–4.0)
MCH: 30.8 pg (ref 26.0–34.0)
MCHC: 33.5 g/dL (ref 30.0–36.0)
MCV: 91.7 fL (ref 78.0–100.0)
MONOS PCT: 8 %
Monocytes Absolute: 0.7 10*3/uL (ref 0.1–1.0)
NEUTROS PCT: 81 %
Neutro Abs: 7.1 10*3/uL (ref 1.7–7.7)
Platelets: 151 10*3/uL (ref 150–400)
RBC: 3.51 MIL/uL — AB (ref 4.22–5.81)
RDW: 16.5 % — ABNORMAL HIGH (ref 11.5–15.5)
WBC: 8.8 10*3/uL (ref 4.0–10.5)

## 2015-08-09 LAB — CBG MONITORING, ED
GLUCOSE-CAPILLARY: 109 mg/dL — AB (ref 65–99)
Glucose-Capillary: 99 mg/dL (ref 65–99)
Glucose-Capillary: 114 mg/dL — ABNORMAL HIGH (ref 65–99)
Glucose-Capillary: 110 mg/dL — ABNORMAL HIGH (ref 65–99)

## 2015-08-09 LAB — TROPONIN I: Troponin I: 0.13 ng/mL — ABNORMAL HIGH (ref <0.031)

## 2015-08-09 MED ORDER — ASPIRIN 81 MG PO CHEW
324.0000 mg | CHEWABLE_TABLET | Freq: Once | ORAL | Status: AC
  Administered 2015-08-09: 324 mg via ORAL
  Filled 2015-08-09: qty 4

## 2015-08-09 MED ORDER — SODIUM POLYSTYRENE SULFONATE 15 GM/60ML PO SUSP
30.0000 g | Freq: Once | ORAL | Status: AC
  Administered 2015-08-09: 30 g via ORAL

## 2015-08-09 MED ORDER — SODIUM CHLORIDE 0.9 % IV BOLUS (SEPSIS)
1000.0000 mL | Freq: Once | INTRAVENOUS | Status: AC
  Administered 2015-08-09: 1000 mL via INTRAVENOUS

## 2015-08-09 MED ORDER — DEXTROSE 50 % IV SOLN
1.0000 | Freq: Once | INTRAVENOUS | Status: AC
  Administered 2015-08-09: 50 mL via INTRAVENOUS
  Filled 2015-08-09: qty 50

## 2015-08-09 MED ORDER — INSULIN ASPART 100 UNIT/ML IV SOLN
5.0000 [IU] | Freq: Once | INTRAVENOUS | Status: AC
  Administered 2015-08-09: 5 [IU] via INTRAVENOUS
  Filled 2015-08-09: qty 1

## 2015-08-09 NOTE — ED Notes (Signed)
CBG 110. 

## 2015-08-09 NOTE — ED Notes (Signed)
CBG 114 

## 2015-08-09 NOTE — ED Notes (Signed)
Reported to Dr. Billy Fischer,  that pt.s Troponin is 1.8 ECG given to Dr. Billy Fischer

## 2015-08-09 NOTE — ED Provider Notes (Signed)
CSN: [id number]     Arrival date & time 08/09/15  1506 History   None    Chief Complaint  Patient presents with  . Hypoglycemia     The history is provided by the patient, a relative and a caregiver.    Hx of CAD sp PCI, CHF Fell 4 weeks ago.  Was dig tox, now off dig.   BG high recently in 400s. Started metformin, on actos for a long time.  Just started insulin.   HAd prior hyponatremia.   BG was better with therapies, last night it was 96.  Given long acting insulin at 8PM.   This AM not himself, confused.  Then apneic, unresponsive, BG 26.  Improved markedly with 1 amp D50 to baseline mental status.   Fell yesterday  "I feel fine except im tired" No dysuria CP, SOB, dysuria.  Nml uop No fevers n or v Ate meal last evening prior to insulin administration.    Past Medical History  Diagnosis Date  . NIDDM (non-insulin dependent diabetes mellitus)   . Nephropathy   . History of ST elevation myocardial infarction (STEMI) of anterior wall June 2003    Anterior MI in the setting of GI bleed; initially treated medically; 10 weeks later underwent cardiac catheterization PCI the LAD.  Marland Kitchen CAD S/P percutaneous coronary angioplasty July 2003    PCI of the LAD - HepaCoat BMS 2.5 mm x 13 mm (3.7 mm).  . History of Ischemic cardiomyopathy Summer of 2003 - 2010    2003: Initial EF Post PCI was 40%; by Myoview in 2005 EF is 51% but no ischemia.;  No recurrence of heart failure or following PCI in 2003;; Echo 03/2008: Normal LV size & function, Gr 1 DD with elevated LAP, Mild Ao sclerosis  . Obesity   . Actinic keratoses   . Colon cancer ([place])   . Dyslipidemia   . Gout   . Hypothyroid   . Anemia   . 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 Disc herniation   . Hard of hearing      very hard of hearing   Past Surgical History  Procedure Laterality Date  . Colectomy  2003  . Colonoscopy  2008  . Tte  2010  . Nm myoview ltd  2005  . Cardiac catheterization  2003   Family  History  Problem Relation Age of Onset  . Family history unknown: Yes   Social History  Substance Use Topics  . Smoking status: Current Every Day Smoker -- 2.00 packs/day    Types: Cigars  . Smokeless tobacco: Never Used     Comment: 2-3 cigars a day  . Alcohol Use: No    Review of Systems  Constitutional: Positive for fatigue. Negative for fever and chills.  HENT: Negative for congestion.   Respiratory: Negative for cough, shortness of breath and wheezing.   Cardiovascular: Negative for chest pain and leg swelling.  Gastrointestinal: Negative for nausea, vomiting and abdominal pain.  Endocrine: Negative for polyuria.  Genitourinary: Negative for dysuria, urgency, flank pain, decreased urine volume and enuresis.  Musculoskeletal: Negative for myalgias, back pain and arthralgias.  Skin: Negative for rash.  Neurological: Positive for light-headedness. Negative for syncope and headaches.  Psychiatric/Behavioral: Negative for confusion.  All other systems reviewed and are negative.     Allergies  Review of patient's allergies indicates no known allergies.  Home Medications   Prior to Admission medications   Medication Sig Start  Date End Date Taking? Authorizing Provider  aspirin 81 MG tablet Take 81 mg by mouth daily.     Yes Historical Provider, MD  COLCRYS 0.6 MG tablet TAKE ONE TABLET BY MOUTH ONE TIME DAILY Patient taking differently: TAKE ONE TABLET BY MOUTH ONE TIME DAILY  PRN 01/09/12  Yes Denita Lung, MD  diclofenac sodium (VOLTAREN) 1 % GEL Apply 2 to 3 times per day 01/14/15  Yes Denita Lung, MD  Dorzolamide HCl-Timolol Mal PF 22.3-6.8 MG/ML SOLN Apply 1 drop to eye 2 (two) times daily.   Yes Historical Provider, MD  fluticasone (CUTIVATE) 0.05 % cream  01/04/14  Yes Historical Provider, MD  folic acid (FOLVITE) 1 MG tablet Take 1 tablet (1 mg total) by mouth daily. 02/04/15  Yes Denita Lung, MD  Insulin Glargine (TOUJEO SOLOSTAR) 300 UNIT/ML SOPN Inject 6  Units into the skin at bedtime. 08/05/15  Yes Denita Lung, MD  Insulin Pen Needle (PEN NEEDLES) 32G X 4 MM MISC 1 each by Does not apply route daily. DX: E11.8 08/04/15  Yes Denita Lung, MD  latanoprost (XALATAN) 0.005 % ophthalmic solution Place 1 drop into both eyes at bedtime.   Yes Historical Provider, MD  levothyroxine (SYNTHROID, LEVOTHROID) 88 MCG tablet TAKE ONE TABLET BY MOUTH ONCE DAILY 08/06/15  Yes Denita Lung, MD  metFORMIN (GLUCOPHAGE-XR) 500 MG 24 hr tablet Take 1 tablet (500 mg total) by mouth daily with breakfast. 08/06/15  Yes Denita Lung, MD  Multiple Vitamins-Minerals (MENS MULTIVITAMIN PLUS PO) Take by mouth.     Yes Historical Provider, MD  omeprazole (PRILOSEC) 20 MG capsule TAKE ONE CAPSULE BY MOUTH ONCE DAILY 08/06/15  Yes Denita Lung, MD  pioglitazone (ACTOS) 30 MG tablet TAKE ONE TABLET BY MOUTH ONCE DAILY 08/05/14  Yes Denita Lung, MD  simvastatin (ZOCOR) 40 MG tablet TAKE ONE TABLET BY MOUTH ONCE DAILY 05/05/15  Yes Denita Lung, MD  tamsulosin (FLOMAX) 0.4 MG CAPS capsule TAKE ONE CAPSULE BY MOUTH ONCE DAILY. 07/11/15  Yes Denita Lung, MD  carvedilol (COREG) 6.25 MG tablet TAKE ONE TABLET BY MOUTH ONCE DAILY 09/10/13   Denita Lung, MD  digoxin (LANOXIN) 0.125 MG tablet Take 1 tablet (125 mcg total) by mouth daily. 02/04/15   Denita Lung, MD  pioglitazone (ACTOS) 30 MG tablet TAKE ONE TABLET BY MOUTH ONCE DAILY 08/06/15   Denita Lung, MD   BP 120/52 mmHg  Pulse 70  Temp(Src) 98.5 F (36.9 C) (Oral)  Resp 18  Ht 5\' 6"  (1.676 m)  Wt 58.6 kg  BMI 20.86 kg/m2  SpO2 100% Physical Exam  Constitutional: He is oriented to person, place, and time. He appears well-developed and well-nourished. No distress.  Elderly, well appearing however, jovial, appears younger than stated age.    HENT:  Head: Normocephalic.  Nose: Nose normal.  Old abrasions to face healing  Eyes: Conjunctivae and EOM are normal.  No conj pallor  Neck: Normal range of  motion. Neck supple. No tracheal deviation present.  Cardiovascular: Normal rate, regular rhythm and normal heart sounds.   No murmur heard. Pulmonary/Chest: Effort normal and breath sounds normal. No respiratory distress. He has no wheezes. He has no rales. He exhibits no tenderness.  Abdominal: Soft. Bowel sounds are normal. He exhibits no distension and no mass. There is no tenderness.  Musculoskeletal: Normal range of motion. He exhibits no edema.  No lower extremity edema, calf tenderness, warmth, erythema or  palpable cords   Neurological: He is alert and oriented to person, place, and time.  Skin: Skin is warm and dry. No rash noted. No pallor.  Psychiatric: He has a normal mood and affect.  Nursing note and vitals reviewed.   ED Course  Procedures (including critical care time) Labs Review Labs Reviewed  CBC WITH DIFFERENTIAL/PLATELET - Abnormal; Notable for the following:    RBC 3.51 (*)    Hemoglobin 10.8 (*)    HCT 32.2 (*)    RDW 16.5 (*)    All other components within normal limits  COMPREHENSIVE METABOLIC PANEL - Abnormal; Notable for the following:    Sodium 131 (*)    Potassium 6.0 (*)    Chloride 99 (*)    Glucose, Bld 132 (*)    BUN 70 (*)    Creatinine, Ser 3.43 (*)    Total Protein 5.5 (*)    Albumin 2.4 (*)    AST 99 (*)    Alkaline Phosphatase 301 (*)    Total Bilirubin 2.1 (*)    GFR calc non Af Amer 13 (*)    GFR calc Af Amer 16 (*)    All other components within normal limits  TROPONIN I - Abnormal; Notable for the following:    Troponin I 0.13 (*)    All other components within normal limits  GLUCOSE, CAPILLARY - Abnormal; Notable for the following:    Glucose-Capillary 200 (*)    All other components within normal limits  CBG MONITORING, ED - Abnormal; Notable for the following:    Glucose-Capillary 114 (*)    All other components within normal limits  CBG MONITORING, ED - Abnormal; Notable for the following:    Glucose-Capillary 110 (*)     All other components within normal limits  I-STAT TROPOININ, ED - Abnormal; Notable for the following:    Troponin i, poc 0.18 (*)    All other components within normal limits  CBG MONITORING, ED - Abnormal; Notable for the following:    Glucose-Capillary 109 (*)    All other components within normal limits  URINE CULTURE  URINALYSIS, ROUTINE W REFLEX MICROSCOPIC (NOT AT [facility])  POTASSIUM  POTASSIUM  CBG MONITORING, ED    Imaging Review Dg Chest 2 View  08/09/2015  CLINICAL DATA:  Hypoglycemia.  Fatigue.  Fall. EXAM: CHEST  2 VIEW COMPARISON:  03/27/2008 chest radiograph. FINDINGS: Stable cardiomediastinal silhouette with normal heart size. No pneumothorax. No pleural effusion. There is patchy opacity overlying the lower thoracic spine on the lateral view, probably correlating with a right lower lobe on the frontal view. No pulmonary edema. IMPRESSION: Patchy right lower lobe opacity, nonspecific, cannot exclude a right lower lobe pneumonia. Recommend follow-up PA and lateral post treatment chest radiographs in 4-6 weeks. Electronically Signed   By: Ilona Sorrel M.D.   On: 08/09/2015 18:45   Ct Head Wo Contrast  08/09/2015  CLINICAL DATA:  Unresponsive EXAM: CT HEAD WITHOUT CONTRAST TECHNIQUE: Contiguous axial images were obtained from the base of the skull through the vertex without intravenous contrast. COMPARISON:  None. FINDINGS: Bony calvarium is intact. No gross soft tissue abnormality is seen. Paranasal sinuses are within normal limits. Diffuse atrophic changes are noted. No findings to suggest acute hemorrhage, acute infarction or space-occupying mass lesion are noted. IMPRESSION: Atrophic changes without acute abnormality. Electronically Signed   By: Inez Catalina M.D.   On: 08/09/2015 19:05   I have personally reviewed and evaluated these images and lab results as  part of my medical decision-making.   EKG Interpretation None      MDM   Final diagnoses:  AKI (acute kidney  injury) ([place])  Hyperkalemia  EKG abnormalities  Elevated troponin  Hypoglycemia   History of coronary artery disease status was PCI recent insulin adjustments plus ED for motion hyperglycemia. He'll he complained of tightness fatigue. There was a recent fall so we'll obtain CT head imaging. GCS 15, mentating well by a spider stable on arrival. Initial blood glucose within normal limits. Serial monitoring without evidence of hypoglycemia. Likely source is acute on chronic kidney disease in the setting of decreased insulin clearance. BUN elevated, could be volume depleted.  Does have hyperkalemia without hyperkalemic changes on EKG. Will temporize the meantime and give IV fluids with insulin/glucose. Troponin was elevated after EKG was obtained demonstrating new T-wave inversions in the lateral leads. No ST segment changes.. Patient denies any recent chest pain.  ASA given.  Will trend, holding anticoagulation as not clearly ACS and pending CT head, acute kidney injury could decrease protein clearance.   6:32 PM spoke to cardiology who recommend medical management, no acute intervention, will see pt.   Admitted to hospitalist without any change in status.    Tammy Sours, MD 08/10/15 [id number]  Gareth Morgan, MD 08/10/15 1234

## 2015-08-09 NOTE — H&P (Addendum)
[facility] Admission History and Physical       [name] [id number] DOB: May 17, 1912 DOA: 08/09/2015  Referring physician:  EDP PCP: Kyle Haste, MD  Specialists:   Chief Complaint: Low Blood Sugar  HPI: Kyle Parrish is a 82 y.o. male with a history of DM2, HTN, CAD, Ischemic Cardiomyopathy, Hyperlipidemia, Actinic Keratosis, and Gout who presents to the ED with Hypoglycemia to 26.    His family arrived to his home and he was confused and had decreasing LOC and EMS was called and he was found to have a Glucose level of 23.   He was administered IV Dextrose and taken to the ED, where he was also found to have Hyperkalemia and Hyponatremia, and an elevated BUN/Cr.  He had a fall yesterday.  His medcations had been changed about 1 month ago and he was discontinued from his Digoxin and Carvedilol Rx due to Digoxin Toxicity, and shortly thereafter he had adjustment in his DM2 meds due to Hyperglycemia with Sugars in the 400's.   His DM2 regimen had been only Actos, but Insulin and long acting Metformin Rx had been added.    His Daughter reports that his appetite has been poor lately.     Of Note: He also has an elevated Troponin of 0.18, but denies having any Chest Pain.      Review of Systems:    Constitutional: No Weight Loss, No Weight Gain, Night Sweats, Fevers, Chills, Dizziness, Light Headedness, Fatigue,  +Generalized Weakness HEENT: No Headaches, Difficulty Swallowing,Tooth/Dental Problems,Sore Throat,  No Sneezing, Rhinitis, Ear Ache, Nasal Congestion, or Post Nasal Drip,  Cardio-vascular:  No Chest pain, Orthopnea, PND, Edema in Lower Extremities, Anasarca, Dizziness, Palpitations  Resp: No Dyspnea, No DOE, No Productive Cough, No Non-Productive Cough, No Hemoptysis, No Wheezing.    GI: No Heartburn, Indigestion, Abdominal Pain, Nausea, Vomiting, Diarrhea, Constipation, Hematemesis, Hematochezia, Melena, Change in Bowel Habits,  +Loss of Appetite    GU: No Dysuria, No Change in Color of Urine, No Urgency or Urinary Frequency, No Flank pain.  Musculoskeletal: No Joint Pain or Swelling, No Decreased Range of Motion, No Back Pain.  Neurologic: No Syncope, No Seizures, Muscle Weakness, Paresthesia, Vision Disturbance or Loss, No Diplopia, No Vertigo, No Difficulty Walking,  Skin: No Rash or Lesions. Psych: No Change in Mood or Affect, No Depression or Anxiety, No Memory loss, No Confusion, or Hallucinations   Past Medical History  Diagnosis Date  . NIDDM (non-insulin dependent diabetes mellitus)   . Nephropathy   . History of ST elevation myocardial infarction (STEMI) of anterior wall June 2003    Anterior MI in the setting of GI bleed; initially treated medically; 10 weeks later underwent cardiac catheterization PCI the LAD.  Marland Kitchen CAD S/P percutaneous coronary angioplasty July 2003    PCI of the LAD - HepaCoat BMS 2.5 mm x 13 mm (3.7 mm).  . History of Ischemic cardiomyopathy Summer of 2003 - 2010    2003: Initial EF Post PCI was 40%; by Myoview in 2005 EF is 51% but no ischemia.;  No recurrence of heart failure or following PCI in 2003;; Echo 03/2008: Normal LV size & function, Gr 1 DD with elevated LAP, Mild Ao sclerosis  . Obesity   . Actinic keratoses   . Colon cancer ([place])   . Dyslipidemia   . Gout   . Hypothyroid   . Anemia   . 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 Disc herniation   .  Hard of hearing      very hard of hearing     Past Surgical History  Procedure Laterality Date  . Colectomy  2003  . Colonoscopy  2008  . Tte  2010  . Nm myoview ltd  2005  . Cardiac catheterization  2003      Prior to Admission medications   Medication Sig Start Date End Date Taking? Authorizing Provider  aspirin 81 MG tablet Take 81 mg by mouth daily.     Yes Historical Provider, MD  COLCRYS 0.6 MG tablet TAKE ONE TABLET BY MOUTH ONE TIME DAILY Patient taking differently: TAKE ONE TABLET BY MOUTH ONE TIME DAILY  PRN  01/09/12  Yes Denita Lung, MD  diclofenac sodium (VOLTAREN) 1 % GEL Apply 2 to 3 times per day 01/14/15  Yes Denita Lung, MD  Dorzolamide HCl-Timolol Mal PF 22.3-6.8 MG/ML SOLN Apply 1 drop to eye 2 (two) times daily.   Yes Historical Provider, MD  fluticasone (CUTIVATE) 0.05 % cream  01/04/14  Yes Historical Provider, MD  folic acid (FOLVITE) 1 MG tablet Take 1 tablet (1 mg total) by mouth daily. 02/04/15  Yes Denita Lung, MD  Insulin Glargine (TOUJEO SOLOSTAR) 300 UNIT/ML SOPN Inject 6 Units into the skin at bedtime. 08/05/15  Yes Denita Lung, MD  Insulin Pen Needle (PEN NEEDLES) 32G X 4 MM MISC 1 each by Does not apply route daily. DX: E11.8 08/04/15  Yes Denita Lung, MD  latanoprost (XALATAN) 0.005 % ophthalmic solution Place 1 drop into both eyes at bedtime.   Yes Historical Provider, MD  levothyroxine (SYNTHROID, LEVOTHROID) 88 MCG tablet TAKE ONE TABLET BY MOUTH ONCE DAILY 08/06/15  Yes Denita Lung, MD  metFORMIN (GLUCOPHAGE-XR) 500 MG 24 hr tablet Take 1 tablet (500 mg total) by mouth daily with breakfast. 08/06/15  Yes Denita Lung, MD  Multiple Vitamins-Minerals (MENS MULTIVITAMIN PLUS PO) Take by mouth.     Yes Historical Provider, MD  omeprazole (PRILOSEC) 20 MG capsule TAKE ONE CAPSULE BY MOUTH ONCE DAILY 08/06/15  Yes Denita Lung, MD  pioglitazone (ACTOS) 30 MG tablet TAKE ONE TABLET BY MOUTH ONCE DAILY 08/05/14  Yes Denita Lung, MD  simvastatin (ZOCOR) 40 MG tablet TAKE ONE TABLET BY MOUTH ONCE DAILY 05/05/15  Yes Denita Lung, MD  tamsulosin (FLOMAX) 0.4 MG CAPS capsule TAKE ONE CAPSULE BY MOUTH ONCE DAILY. 07/11/15  Yes Denita Lung, MD  carvedilol (COREG) 6.25 MG tablet TAKE ONE TABLET BY MOUTH ONCE DAILY 09/10/13   Denita Lung, MD  digoxin (LANOXIN) 0.125 MG tablet Take 1 tablet (125 mcg total) by mouth daily. 02/04/15   Denita Lung, MD  pioglitazone (ACTOS) 30 MG tablet TAKE ONE TABLET BY MOUTH ONCE DAILY 08/06/15   Denita Lung, MD     No Known  Allergies     Social History:  Walks with a Gil[employer] Rile, Able to perform all his ADLs  reports that he has been smoking Cigars.  He has never used smokeless tobacco. He reports that he does not drink alcohol or use illicit drugs.    Family History  Problem Relation Age of Onset  . Family history unknown: Yes       Physical Exam:  GEN:    Pleasant Elderly Thin  82 y.o. Caucasian male examined and in no acute distress; cooperative with exam Filed Vitals:   08/09/15 1730 08/09/15 1745 08/09/15 1800 08/09/15 1815  BP: 117/55 113/58 110/52  109/52  Pulse: 63 64 62 69  Temp:      TempSrc:      Resp: 15 16 15 19   Height:      Weight:      SpO2: 100% 100% 100% 100%   Blood pressure 109/52, pulse 69, temperature 98.3 F (36.8 C), temperature source Oral, resp. rate 19, height 5\' 8"  (1.727 m), weight 58.968 kg (130 lb), SpO2 100 %. PSYCH: He is alert and oriented x4; does not appear anxious does not appear depressed; affect is normal HEENT: Normocephalic and Atraumatic, Mucous membranes pink; PERRLA; EOM intact; Fundi:  Benign;  No scleral icterus, Nares: Patent, Oropharynx: Clear, Edentulous,    Neck:  FROM, No Cervical Lymphadenopathy nor Thyromegaly or Carotid Bruit; No JVD; Breasts:: Not examined CHEST WALL: No tenderness CHEST: Normal respiration, clear to auscultation bilaterally HEART: Regular rate and rhythm; no murmurs rubs or gallops BACK: No kyphosis or scoliosis; No CVA tenderness ABDOMEN: Positive Bowel Sounds, Scaphoid, Soft Non-Tender, No Rebound or Guarding; No Masses, No Organomegaly Rectal Exam: Not done EXTREMITIES: No Cyanosis, Clubbing, or Edema; No Ulcerations. Genitalia: not examined PULSES: 2+ and symmetric SKIN: Normal hydration no rash or ulceration CNS:  Alert and Oriented x 4, No Focal Deficits Vascular: pulses palpable throughout    Labs on Admission:  Basic Metabolic Panel:  Recent Labs Lab 08/09/15 1657  NA 131*  K 6.0*  CL 99*  CO2 22  GLUCOSE  132*  BUN 70*  CREATININE 3.43*  CALCIUM 9.2   Liver Function Tests:  Recent Labs Lab 08/09/15 1657  AST 99*  ALT 39  ALKPHOS 301*  BILITOT 2.1*  PROT 5.5*  ALBUMIN 2.4*   No results for input(s): LIPASE, AMYLASE in the last 168 hours. No results for input(s): AMMONIA in the last 168 hours. CBC:  Recent Labs Lab 08/09/15 1657  WBC 8.8  NEUTROABS 7.1  HGB 10.8*  HCT 32.2*  MCV 91.7  PLT 151   Cardiac Enzymes: No results for input(s): CKTOTAL, CKMB, CKMBINDEX, TROPONINI in the last 168 hours.  BNP (last 3 results) No results for input(s): BNP in the last 8760 hours.  ProBNP (last 3 results) No results for input(s): PROBNP in the last 8760 hours.  CBG:  Recent Labs Lab 08/09/15 1515 08/09/15 1555 08/09/15 1631 08/09/15 1810  GLUCAP 99 114* 110* 109*    Radiological Exams on Admission: No results found.   EKG: Independently reviewed. Ordered       Assessment/Plan:      82 y.o. male with  Principal Problem:    Hypoglycemia   Hold Anti-Hypoglycemics   Hypoglycemic Protocol   Monitor Glucose Levels   Recommend Discontinuing Long acting Insulin Rx and Metformin Rx     Active Problems:    Hyperkalemia   Administered IV Glucose and IV Toujeo Insulin for Short-Term Rx   Kayexalate 30 grams PO x 1   Recheck K+ in 6 hours, Re-Treat K+ if > 5.3      AKI (acute kidney injury) ([place])- baseline Cr = 2.3   Gentle IVFs   Monitor BUN/Cr        Elevated troponin   Cards consulted and Seeing   Cycle Troponins      Hyponatremia- due to Dehydration   IVFs with NSS   Moniiotr Na+ Trend    Coronary artery disease, with PCI of the LAD; following an anterior MI treated medically.   Hx   ASA Rx, and Continue Simvastatin Rx    History of  Ischemic cardiomyopathy   Monitor I/Os    Type 2 diabetes with complication (HCC)   Holding DM2 Medss   SSI Coverage PRN      Hypertension associated with diabetes (HCC)   Monitor BPs   Currently controlled  on No BP Meds       Hypothyroid   Continue Levothyroxine Rx      Hyperlipidemia associated with type 2 diabetes mellitus (HCC)   Continue Simvastatin Rx    DVT Prophylaxis     Lovenox      Code Status:     FULL CODE      Family Communication:   Daughter at Bedside  Disposition Plan:    Inpatient Status        Time spent:    [place] C [facility] Pager [phone]   If [place] Please Contact the Day Rounding Team MD for [facility]  If 7PM-7AM, Please Contact Night-Floor Coverage  [email or web address] Password [facility] 08/09/2015, 6:28 PM     ADDENDUM:   Patient was seen and examined on 08/09/2015

## 2015-08-09 NOTE — Consult Note (Signed)
Chief Complaint/Reason for Consult: positive troponin   Requesting Physician: Dr. Arnoldo Morale  PCP:  Wyatt Haste, MD Primary Cardiologist:Dr. Ellyn Hack  HPI:   Patient is a 82 year old male with history of DM, CAD s/p PCI to LAD 2003, HLD.  He presented today with symptomatic hypoglycemia (26), see hospitalist H&P for full details.  1 month ago he was noted to have digoxin toxicity and many of his medications were changed including DM meds.   Presented after falling and other symptoms a/w hypoglycemia.  Improved with D50.  No CP, SOB.  He's noted some LE edema over the past 4 weeks.  He can ambulate with aide but otherwise pretty limited.    Past Medical History  Diagnosis Date  . NIDDM (non-insulin dependent diabetes mellitus)   . Nephropathy   . History of ST elevation myocardial infarction (STEMI) of anterior wall June 2003    Anterior MI in the setting of GI bleed; initially treated medically; 10 weeks later underwent cardiac catheterization PCI the LAD.  Marland Kitchen CAD S/P percutaneous coronary angioplasty July 2003    PCI of the LAD - HepaCoat BMS 2.5 mm x 13 mm (3.7 mm).  . History of Ischemic cardiomyopathy Summer of 2003 - 2010    2003: Initial EF Post PCI was 40%; by Myoview in 2005 EF is 51% but no ischemia.;  No recurrence of heart failure or following PCI in 2003;; Echo 03/2008: Normal LV size & function, Gr 1 DD with elevated LAP, Mild Ao sclerosis  . Obesity   . Actinic keratoses   . Colon cancer ([place])   . Dyslipidemia   . Gout   . Hypothyroid   . Anemia   . 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 Disc herniation   . Hard of hearing      very hard of hearing    Past Surgical History  Procedure Laterality Date  . Colectomy  2003  . Colonoscopy  2008  . Tte  2010  . Nm myoview ltd  2005  . Cardiac catheterization  2003    Family History  Problem Relation Age of Onset  . Family history unknown: Yes   Social History:  reports that he has been  smoking Cigars.  He has never used smokeless tobacco. He reports that he does not drink alcohol or use illicit drugs.  Allergies: No Known Allergies  No current facility-administered medications on file prior to encounter.   Current Outpatient Prescriptions on File Prior to Encounter  Medication Sig Dispense Refill  . aspirin 81 MG tablet Take 81 mg by mouth daily.      Marland Kitchen COLCRYS 0.6 MG tablet TAKE ONE TABLET BY MOUTH ONE TIME DAILY (Patient taking differently: TAKE ONE TABLET BY MOUTH ONE TIME DAILY  PRN) 30 tablet PRN  . diclofenac sodium (VOLTAREN) 1 % GEL Apply 2 to 3 times per day 100 g 5  . Dorzolamide HCl-Timolol Mal PF 22.3-6.8 MG/ML SOLN Apply 1 drop to eye 2 (two) times daily.    . fluticasone (CUTIVATE) 0.05 % cream     . folic acid (FOLVITE) 1 MG tablet Take 1 tablet (1 mg total) by mouth daily. 90 tablet 3  . Insulin Glargine (TOUJEO SOLOSTAR) 300 UNIT/ML SOPN Inject 6 Units into the skin at bedtime. 5 pen 1  . Insulin Pen Needle (PEN NEEDLES) 32G X 4 MM MISC 1 each by Does not apply route daily. DX: E11.8 30 each 11  .  latanoprost (XALATAN) 0.005 % ophthalmic solution Place 1 drop into both eyes at bedtime.    Marland Kitchen levothyroxine (SYNTHROID, LEVOTHROID) 88 MCG tablet TAKE ONE TABLET BY MOUTH ONCE DAILY 30 tablet 5  . metFORMIN (GLUCOPHAGE-XR) 500 MG 24 hr tablet Take 1 tablet (500 mg total) by mouth daily with breakfast. 30 tablet 5  . Multiple Vitamins-Minerals (MENS MULTIVITAMIN PLUS PO) Take by mouth.      Marland Kitchen omeprazole (PRILOSEC) 20 MG capsule TAKE ONE CAPSULE BY MOUTH ONCE DAILY 30 capsule 5  . pioglitazone (ACTOS) 30 MG tablet TAKE ONE TABLET BY MOUTH ONCE DAILY 30 tablet 2  . simvastatin (ZOCOR) 40 MG tablet TAKE ONE TABLET BY MOUTH ONCE DAILY 90 tablet 2  . tamsulosin (FLOMAX) 0.4 MG CAPS capsule TAKE ONE CAPSULE BY MOUTH ONCE DAILY. 90 capsule 0  . carvedilol (COREG) 6.25 MG tablet TAKE ONE TABLET BY MOUTH ONCE DAILY 30 tablet 0  . digoxin (LANOXIN) 0.125 MG tablet Take 1  tablet (125 mcg total) by mouth daily. 30 tablet 5  . pioglitazone (ACTOS) 30 MG tablet TAKE ONE TABLET BY MOUTH ONCE DAILY 30 tablet 5     Results for orders placed or performed during the hospital encounter of 08/09/15 (from the past 48 hour(s))  POC CBG, ED     Status: None   Collection Time: 08/09/15  3:15 PM  Result Value Ref Range   Glucose-Capillary 99 65 - 99 mg/dL  CBG monitoring, ED     Status: Abnormal   Collection Time: 08/09/15  3:55 PM  Result Value Ref Range   Glucose-Capillary 114 (H) 65 - 99 mg/dL  CBG monitoring, ED     Status: Abnormal   Collection Time: 08/09/15  4:31 PM  Result Value Ref Range   Glucose-Capillary 110 (H) 65 - 99 mg/dL  CBC with Differential     Status: Abnormal   Collection Time: 08/09/15  4:57 PM  Result Value Ref Range   WBC 8.8 4.0 - 10.5 K/uL   RBC 3.51 (L) 4.22 - 5.81 MIL/uL   Hemoglobin 10.8 (L) 13.0 - 17.0 g/dL   HCT 32.2 (L) 39.0 - 52.0 %   MCV 91.7 78.0 - 100.0 fL   MCH 30.8 26.0 - 34.0 pg   MCHC 33.5 30.0 - 36.0 g/dL   RDW 16.5 (H) 11.5 - 15.5 %   Platelets 151 150 - 400 K/uL   Neutrophils Relative % 81 %   Lymphocytes Relative 11 %   Monocytes Relative 8 %   Eosinophils Relative 0 %   Basophils Relative 0 %   Neutro Abs 7.1 1.7 - 7.7 K/uL   Lymphs Abs 1.0 0.7 - 4.0 K/uL   Monocytes Absolute 0.7 0.1 - 1.0 K/uL   Eosinophils Absolute 0.0 0.0 - 0.7 K/uL   Basophils Absolute 0.0 0.0 - 0.1 K/uL   RBC Morphology POLYCHROMASIA PRESENT   Comprehensive metabolic panel     Status: Abnormal   Collection Time: 08/09/15  4:57 PM  Result Value Ref Range   Sodium 131 (L) 135 - 145 mmol/L   Potassium 6.0 (H) 3.5 - 5.1 mmol/L   Chloride 99 (L) 101 - 111 mmol/L   CO2 22 22 - 32 mmol/L   Glucose, Bld 132 (H) 65 - 99 mg/dL   BUN 70 (H) 6 - 20 mg/dL   Creatinine, Ser 3.43 (H) 0.61 - 1.24 mg/dL   Calcium 9.2 8.9 - 10.3 mg/dL   Total Protein 5.5 (L) 6.5 - 8.1 g/dL  Albumin 2.4 (L) 3.5 - 5.0 g/dL   AST 99 (H) 15 - 41 U/L   ALT 39 17 -  63 U/L   Alkaline Phosphatase 301 (H) 38 - 126 U/L   Total Bilirubin 2.1 (H) 0.3 - 1.2 mg/dL   GFR calc non Af Amer 13 (L) >60 mL/min   GFR calc Af Amer 16 (L) >60 mL/min    Comment: (NOTE) The eGFR has been calculated using the CKD EPI equation. This calculation has not been validated in all clinical situations. eGFR's persistently <60 mL/min signify possible Chronic Kidney Disease.    Anion gap 10 5 - 15  I-Stat Troponin, ED (not at [facility])     Status: Abnormal   Collection Time: 08/09/15  5:05 PM  Result Value Ref Range   Troponin i, poc 0.18 (HH) 0.00 - 0.08 ng/mL   Comment NOTIFIED PHYSICIAN    Comment 3            Comment: Due to the release kinetics of cTnI, a negative result within the first hours of the onset of symptoms does not rule out myocardial infarction with certainty. If myocardial infarction is still suspected, repeat the test at appropriate intervals.   CBG monitoring, ED     Status: Abnormal   Collection Time: 08/09/15  6:10 PM  Result Value Ref Range   Glucose-Capillary 109 (H) 65 - 99 mg/dL  Troponin I     Status: Abnormal   Collection Time: 08/09/15  8:04 PM  Result Value Ref Range   Troponin I 0.13 (H) <0.031 ng/mL    Comment:        PERSISTENTLY INCREASED TROPONIN VALUES IN THE RANGE OF 0.04-0.49 ng/mL CAN BE SEEN IN:       -UNSTABLE ANGINA       -CONGESTIVE HEART FAILURE       -MYOCARDITIS       -CHEST TRAUMA       -ARRYHTHMIAS       -LATE PRESENTING MYOCARDIAL INFARCTION       -COPD   CLINICAL FOLLOW-UP RECOMMENDED.    Dg Chest 2 View  08/09/2015  CLINICAL DATA:  Hypoglycemia.  Fatigue.  Fall. EXAM: CHEST  2 VIEW COMPARISON:  03/27/2008 chest radiograph. FINDINGS: Stable cardiomediastinal silhouette with normal heart size. No pneumothorax. No pleural effusion. There is patchy opacity overlying the lower thoracic spine on the lateral view, probably correlating with a right lower lobe on the frontal view. No pulmonary edema. IMPRESSION: Patchy  right lower lobe opacity, nonspecific, cannot exclude a right lower lobe pneumonia. Recommend follow-up PA and lateral post treatment chest radiographs in 4-6 weeks. Electronically Signed   By: Ilona Sorrel M.D.   On: 08/09/2015 18:45   Ct Head Wo Contrast  08/09/2015  CLINICAL DATA:  Unresponsive EXAM: CT HEAD WITHOUT CONTRAST TECHNIQUE: Contiguous axial images were obtained from the base of the skull through the vertex without intravenous contrast. COMPARISON:  None. FINDINGS: Bony calvarium is intact. No gross soft tissue abnormality is seen. Paranasal sinuses are within normal limits. Diffuse atrophic changes are noted. No findings to suggest acute hemorrhage, acute infarction or space-occupying mass lesion are noted. IMPRESSION: Atrophic changes without acute abnormality. Electronically Signed   By: Inez Catalina M.D.   On: 08/09/2015 19:05    JEH:UDJSHF of systems is negative unless per above HPI  Filed Vitals:   08/09/15 1815 08/09/15 1900 08/09/15 1915 08/09/15 2023  BP: 109/52 118/58 104/55 120/52  Pulse: 69 74 71 70  Temp:  98.5 F (36.9 C)  TempSrc:    Oral  Resp: _0 Height:    _1  (1.676 m)  Weight:    58.6 kg (129 lb 3 oz)  SpO2: 100% 100% 100% 100%   Wt Readings from Last 10 Encounters:  08/09/15 58.6 kg (129 lb 3 oz)  08/01/15 58.968 kg (130 lb)  07/28/15 59.421 kg (131 lb)  07/18/15 58.968 kg (130 lb)  04/08/15 65.318 kg (144 lb)  11/20/14 71.215 kg (157 lb)  05/09/14 64.864 kg (143 lb)  01/25/14 67.586 kg (149 lb)  01/11/14 67.586 kg (149 lb)  09/21/13 68.493 kg (151 lb)    PE:  General: No acute distress HEENT: Atraumatic, EOMI, mucous membranes moist CV: RRR, distant heart sounds, faint SEM, no gallops. No JVD at 45 degrees. No HJR. Respiratory: Clear, no crackles. Normal work of breathing ABD: Non-distended and non-tender. No palpable organomegaly.  Extremities: 2+ radial pulses bilaterally. 2+ bilateral edema. Neuro/Psych: CN grossly intact,  alert and oriented  Assessment/Plan Mr. Ginsberg is 6, with history of CAD coming in with hypoglycemia and hyperkalemia, and acute on chronic kidney injury, with mildly positive troponin and no clinical symptoms suggestive of acute coronary syndrome.  Patient and family would like to be conservative in management.   Positive troponin: likely type 2 from hypotension, breakdown from being down - Add on CK/ CKMB to screen for rhabdomyolysis - Daily ECG - Trend troponin - No need for heparin drip at this time - Continue home aspirin  HLD - Continue Simvastatin  History of digoxin toxicity - If still on digoxin, re-send digoxin level given worsening kidney dysfunction.  Would be reasonable to hold either way given CKD.    Lolita Cram Means  MD 08/09/2015, 8:53 PM

## 2015-08-09 NOTE — ED Notes (Signed)
CBG 109

## 2015-08-09 NOTE — ED Notes (Signed)
Pt. Became unresponsive and apenic at his home and when  [facility] repsonders arrived pt. s cbg was 26.  They inserted NPA and assisted with ventilations for a  aminute or two,.  AFter giving 1 amp of D50, pt.s cbg increased to 240.  Pt. Is 82 years old, lives by himself and has family checking on him.  He has been diagnosed with diabetes and his blood sugars have been up and down the last week.  AT the present time pt. Is alert and has requested to eat.  His skin is p/w/d.

## 2015-08-10 ENCOUNTER — Inpatient Hospital Stay (HOSPITAL_COMMUNITY): Payer: Medicare Other

## 2015-08-10 DIAGNOSIS — E162 Hypoglycemia, unspecified: Secondary | ICD-10-CM

## 2015-08-10 DIAGNOSIS — R7989 Other specified abnormal findings of blood chemistry: Secondary | ICD-10-CM

## 2015-08-10 LAB — URINALYSIS, ROUTINE W REFLEX MICROSCOPIC
GLUCOSE, UA: NEGATIVE mg/dL
HGB URINE DIPSTICK: NEGATIVE
Ketones, ur: 15 mg/dL — AB
Nitrite: NEGATIVE
PH: 5 (ref 5.0–8.0)
PROTEIN: 30 mg/dL — AB
SPECIFIC GRAVITY, URINE: 1.019 (ref 1.005–1.030)

## 2015-08-10 LAB — COMPREHENSIVE METABOLIC PANEL
ALT: 36 U/L (ref 17–63)
AST: 73 U/L — AB (ref 15–41)
Albumin: 2.2 g/dL — ABNORMAL LOW (ref 3.5–5.0)
Alkaline Phosphatase: 285 U/L — ABNORMAL HIGH (ref 38–126)
Anion gap: 9 (ref 5–15)
BILIRUBIN TOTAL: 1.2 mg/dL (ref 0.3–1.2)
BUN: 63 mg/dL — AB (ref 6–20)
CHLORIDE: 102 mmol/L (ref 101–111)
CO2: 21 mmol/L — ABNORMAL LOW (ref 22–32)
CREATININE: 3.07 mg/dL — AB (ref 0.61–1.24)
Calcium: 8.5 mg/dL — ABNORMAL LOW (ref 8.9–10.3)
GFR, EST AFRICAN AMERICAN: 18 mL/min — AB (ref >60)
GFR, EST NON AFRICAN AMERICAN: 15 mL/min — AB (ref >60)
Glucose, Bld: 227 mg/dL — ABNORMAL HIGH (ref 65–99)
Potassium: 4.6 mmol/L (ref 3.5–5.1)
Sodium: 132 mmol/L — ABNORMAL LOW (ref 135–145)
TOTAL PROTEIN: 5 g/dL — AB (ref 6.5–8.1)

## 2015-08-10 LAB — POTASSIUM: Potassium: 4.9 mmol/L (ref 3.5–5.1)

## 2015-08-10 LAB — CBC
HCT: 29.3 % — ABNORMAL LOW (ref 39.0–52.0)
Hemoglobin: 9.9 g/dL — ABNORMAL LOW (ref 13.0–17.0)
MCH: 30.4 pg (ref 26.0–34.0)
MCHC: 33.8 g/dL (ref 30.0–36.0)
MCV: 89.9 fL (ref 78.0–100.0)
PLATELETS: 143 10*3/uL — AB (ref 150–400)
RBC: 3.26 MIL/uL — AB (ref 4.22–5.81)
RDW: 16.7 % — AB (ref 11.5–15.5)
WBC: 6.1 10*3/uL (ref 4.0–10.5)

## 2015-08-10 LAB — URINE MICROSCOPIC-ADD ON: RBC / HPF: NONE SEEN RBC/hpf (ref 0–5)

## 2015-08-10 LAB — GLUCOSE, CAPILLARY
GLUCOSE-CAPILLARY: 207 mg/dL — AB (ref 65–99)
GLUCOSE-CAPILLARY: 213 mg/dL — AB (ref 65–99)
GLUCOSE-CAPILLARY: 143 mg/dL — AB (ref 65–99)
GLUCOSE-CAPILLARY: 60 mg/dL — AB (ref 65–99)
GLUCOSE-CAPILLARY: 82 mg/dL (ref 65–99)
Glucose-Capillary: 96 mg/dL (ref 65–99)

## 2015-08-10 LAB — TROPONIN I
TROPONIN I: 0.14 ng/mL — AB (ref <0.031)
TROPONIN I: 0.14 ng/mL — AB (ref <0.031)
TROPONIN I: 0.15 ng/mL — AB (ref <0.031)

## 2015-08-10 LAB — CK: CK TOTAL: 122 U/L (ref 49–397)

## 2015-08-10 MED ORDER — LEVOTHYROXINE SODIUM 88 MCG PO TABS
88.0000 ug | ORAL_TABLET | Freq: Every day | ORAL | Status: DC
  Administered 2015-08-10 – 2015-08-13 (×4): 88 ug via ORAL
  Filled 2015-08-10 (×4): qty 1

## 2015-08-10 MED ORDER — ONDANSETRON HCL 4 MG/2ML IJ SOLN: 4.0000 mg | Freq: Four times a day (QID) | INTRAMUSCULAR | Status: DC | PRN

## 2015-08-10 MED ORDER — SIMVASTATIN 40 MG PO TABS
40.0000 mg | ORAL_TABLET | Freq: Every day | ORAL | Status: DC
  Administered 2015-08-10: 40 mg via ORAL
  Filled 2015-08-10: qty 1

## 2015-08-10 MED ORDER — ACETAMINOPHEN 650 MG RE SUPP: 650.0000 mg | Freq: Four times a day (QID) | RECTAL | Status: DC | PRN

## 2015-08-10 MED ORDER — DORZOLAMIDE HCL-TIMOLOL MAL 2-0.5 % OP SOLN
1.0000 [drp] | Freq: Two times a day (BID) | OPHTHALMIC | Status: DC
  Administered 2015-08-10 – 2015-08-13 (×7): 1 [drp] via OPHTHALMIC
  Filled 2015-08-10: qty 10

## 2015-08-10 MED ORDER — INSULIN ASPART 100 UNIT/ML ~~LOC~~ SOLN
0.0000 [IU] | SUBCUTANEOUS | Status: DC
  Administered 2015-08-10: 3 [IU] via SUBCUTANEOUS
  Administered 2015-08-10 – 2015-08-11 (×3): 1 [IU] via SUBCUTANEOUS
  Administered 2015-08-11: 2 [IU] via SUBCUTANEOUS
  Administered 2015-08-12 (×2): 1 [IU] via SUBCUTANEOUS
  Administered 2015-08-13 (×2): 2 [IU] via SUBCUTANEOUS

## 2015-08-10 MED ORDER — ENOXAPARIN SODIUM 30 MG/0.3ML ~~LOC~~ SOLN
30.0000 mg | SUBCUTANEOUS | Status: DC
  Administered 2015-08-10 – 2015-08-13 (×4): 30 mg via SUBCUTANEOUS
  Filled 2015-08-10 (×4): qty 0.3

## 2015-08-10 MED ORDER — TAMSULOSIN HCL 0.4 MG PO CAPS
0.4000 mg | ORAL_CAPSULE | Freq: Every day | ORAL | Status: DC
  Administered 2015-08-10 – 2015-08-13 (×4): 0.4 mg via ORAL
  Filled 2015-08-10 (×4): qty 1

## 2015-08-10 MED ORDER — PANTOPRAZOLE SODIUM 40 MG PO TBEC
40.0000 mg | DELAYED_RELEASE_TABLET | Freq: Every day | ORAL | Status: DC
  Administered 2015-08-10 – 2015-08-11 (×2): 40 mg via ORAL
  Filled 2015-08-10 (×2): qty 1

## 2015-08-10 MED ORDER — ONDANSETRON HCL 4 MG PO TABS: 4.0000 mg | ORAL_TABLET | Freq: Four times a day (QID) | ORAL | Status: DC | PRN

## 2015-08-10 MED ORDER — LATANOPROST 0.005 % OP SOLN
1.0000 [drp] | Freq: Every day | OPHTHALMIC | Status: DC
  Administered 2015-08-10 – 2015-08-12 (×3): 1 [drp] via OPHTHALMIC
  Filled 2015-08-10 (×2): qty 2.5

## 2015-08-10 MED ORDER — SODIUM CHLORIDE 0.9 % IV SOLN
INTRAVENOUS | Status: DC
  Administered 2015-08-10: 09:00:00 via INTRAVENOUS

## 2015-08-10 MED ORDER — ACETAMINOPHEN 325 MG PO TABS: 650.0000 mg | ORAL_TABLET | Freq: Four times a day (QID) | ORAL | Status: DC | PRN

## 2015-08-10 MED ORDER — HYDROMORPHONE HCL 1 MG/ML IJ SOLN: 0.5000 mg | INTRAMUSCULAR | Status: DC | PRN

## 2015-08-10 MED ORDER — ASPIRIN EC 325 MG PO TBEC
325.0000 mg | DELAYED_RELEASE_TABLET | Freq: Every day | ORAL | Status: DC
  Administered 2015-08-10 – 2015-08-13 (×4): 325 mg via ORAL
  Filled 2015-08-10 (×4): qty 1

## 2015-08-10 MED ORDER — OXYCODONE HCL 5 MG PO TABS: 5.0000 mg | ORAL_TABLET | ORAL | Status: DC | PRN

## 2015-08-10 NOTE — Progress Notes (Signed)
Hypoglycemic Event  CBG: 60 Treatment: 15 GM carbohydrate snack  Symptoms: None  Follow-up CBG: Time:1833 CBG Result:82  Possible Reasons for Event: Inadequate meal intake    Kyle Parrish

## 2015-08-10 NOTE — Progress Notes (Addendum)
Kyle Parrish [id number]:[id number] DOB: [birth year] DOA: 08/09/2015 PCP: Wyatt Haste, MD  Brief narrative: 82 y/o ? Dm ty II-recent diagnosis-has been mabnaged on ACtos for~ 12 yrs per daughter Implemented Metformin 500 Xr/Toujeo [glargine] 6 U given CGB ~ 400 range ~ 1 mo ago Htn CAD-AMI and Congestive HF ~ 09/2001--Stent to  Distal LAD 3.5 x 13 Hepacoat stent Isch CM HLD Gout Cecal adenoCa -Cecal mass on biopsy 11/06/01 PUD  Admit to [facility] Ed after being found "down" by s-i-l. With CBg 52 Daughter sees patient q pm, checks sugars bid   Past medical history-As per Problem list Chart reviewed as below- reviewed  Consultants:  cardiology  Procedures:  none  Antibiotics:  none   Subjective   Alert pelasant conversant  tol some diet No cp   Objective    Interim History:   Telemetry:    Objective: Filed Vitals:   08/09/15 1900 08/09/15 1915 08/09/15 2023 08/10/15 0459  BP: 118/58 104/55 120/52 106/50  Pulse: 74 71 70 85  Temp:   98.5 F (36.9 C) 98.5 F (36.9 C)  TempSrc:   Oral   Resp: _0 Height:   _1  (1.676 m)   Weight:   58.6 kg (129 lb 3 oz)   SpO2: 100% 100% 100% 98%    Intake/Output Summary (Last 24 hours) at 08/10/15 0758 Last data filed at 08/09/15 2024  Gross per 24 hour  Intake    400 ml  Output      1 ml  Net    399 ml    Exam:  General: eomi ncat, poor dentition, no pallor, R eye pupil irregular Cardiovascular: s1 s2 reg irreg, +M Respiratory: clear no added sound.  No rales no rhonchi Abdomen: soft nt nd no rebound  Skin intact Neuro intact  Data Reviewed: Basic Metabolic Panel:  Recent Labs Lab 08/09/15 1657 08/10/15 0151  NA 131*  --   K 6.0* 4.9  CL 99*  --   CO2 22  --   GLUCOSE 132*  --   BUN 70*  --   CREATININE 3.43*  --   CALCIUM 9.2  --    Liver Function Tests:  Recent Labs Lab 08/09/15 1657  AST 99*  ALT 39  ALKPHOS 301*  BILITOT 2.1*  PROT 5.5*  ALBUMIN 2.4*   No results  for input(s): LIPASE, AMYLASE in the last 168 hours. No results for input(s): AMMONIA in the last 168 hours. CBC:  Recent Labs Lab 08/09/15 1657  WBC 8.8  NEUTROABS 7.1  HGB 10.8*  HCT 32.2*  MCV 91.7  PLT 151   Cardiac Enzymes:  Recent Labs Lab 08/09/15 2004  TROPONINI 0.13*   BNP: Invalid input(s): POCBNP CBG:  Recent Labs Lab 08/09/15 1555 08/09/15 1631 08/09/15 1810 08/09/15 2206 08/10/15 0445  GLUCAP 114* 110* 109* 200* 207*    No results found for this or any previous visit (from the past 240 hour(s)).   Studies:              All Imaging reviewed and is as per above notation   Scheduled Meds: Continuous Infusions:   Assessment/Plan:  1. Hypoglycemia-stop metformin forever-risky to use with Creat>1.5.  Lantus 6 u qpm if pm CBg >200.  3 U if above 150 as OP ? alk phos and AST/ALT-etiology unclear.  Needs OP [place] if thought necessary by PCP AKI/Hyperkalemia/mild met acidossis-improved.  Saline 75--->50 cc/hr Atelectasis-unclear if PNA-no fever, no chills and oriented  CBC wnl.  Hold Abx.  IS q2 while awake Afib/PPM-Chadscore 6/Isch CM-Continue rate control c Coreg as OP.  No digoxin--Given low nl BP.  On no meds at +.  Continue ASa 81 daily.  NO Dig.  ? troponin is 2/2 to Ty II Mi/demandischmeia and renal disease HLd-statin Hypothyroid-Synthroid GErd-pantoprazole BPH-flomax     Long d/w daughter PT to eval Likely d/c to SNF am if sugars stabilize  Verneita Griffes, MD  [facility] Pager [phone] 08/10/2015, 7:58 AM    LOS: 1 day

## 2015-08-11 LAB — GLUCOSE, CAPILLARY
GLUCOSE-CAPILLARY: 119 mg/dL — AB (ref 65–99)
GLUCOSE-CAPILLARY: 189 mg/dL — AB (ref 65–99)
GLUCOSE-CAPILLARY: 141 mg/dL — AB (ref 65–99)
GLUCOSE-CAPILLARY: 100 mg/dL — AB (ref 65–99)
Glucose-Capillary: 134 mg/dL — ABNORMAL HIGH (ref 65–99)
Glucose-Capillary: 114 mg/dL — ABNORMAL HIGH (ref 65–99)

## 2015-08-11 LAB — COMPREHENSIVE METABOLIC PANEL
ALBUMIN: 2 g/dL — AB (ref 3.5–5.0)
ALT: 37 U/L (ref 17–63)
AST: 80 U/L — AB (ref 15–41)
Alkaline Phosphatase: 281 U/L — ABNORMAL HIGH (ref 38–126)
Anion gap: 8 (ref 5–15)
BILIRUBIN TOTAL: 1.3 mg/dL — AB (ref 0.3–1.2)
BUN: 63 mg/dL — AB (ref 6–20)
CHLORIDE: 105 mmol/L (ref 101–111)
CO2: 18 mmol/L — ABNORMAL LOW (ref 22–32)
CREATININE: 3.08 mg/dL — AB (ref 0.61–1.24)
Calcium: 8.2 mg/dL — ABNORMAL LOW (ref 8.9–10.3)
GFR calc Af Amer: 18 mL/min — ABNORMAL LOW (ref >60)
GFR, EST NON AFRICAN AMERICAN: 15 mL/min — AB (ref >60)
GLUCOSE: 164 mg/dL — AB (ref 65–99)
Potassium: 4.4 mmol/L (ref 3.5–5.1)
Sodium: 131 mmol/L — ABNORMAL LOW (ref 135–145)
Total Protein: 4.6 g/dL — ABNORMAL LOW (ref 6.5–8.1)

## 2015-08-11 LAB — URINE CULTURE

## 2015-08-11 LAB — SODIUM, URINE, RANDOM: Sodium, Ur: 14 mmol/L

## 2015-08-11 LAB — CREATININE, URINE, RANDOM: CREATININE, URINE: 121.25 mg/dL

## 2015-08-11 LAB — OSMOLALITY, URINE: Osmolality, Ur: 406 mOsm/kg (ref 300–900)

## 2015-08-11 LAB — TECHNOLOGIST SMEAR REVIEW

## 2015-08-11 LAB — LACTATE DEHYDROGENASE: LDH: 518 U/L — ABNORMAL HIGH (ref 98–192)

## 2015-08-11 LAB — PROTIME-INR
INR: 1.27 (ref 0.00–1.49)
PROTHROMBIN TIME: 16 s — AB (ref 11.6–15.2)

## 2015-08-11 MED ORDER — PANTOPRAZOLE SODIUM 20 MG PO TBEC: 20.0000 mg | DELAYED_RELEASE_TABLET | Freq: Every day | ORAL | Status: DC

## 2015-08-11 MED ORDER — ENSURE ENLIVE PO LIQD
237.0000 mL | Freq: Two times a day (BID) | ORAL | Status: DC
  Administered 2015-08-11 – 2015-08-12 (×2): 237 mL via ORAL

## 2015-08-11 MED ORDER — SODIUM CHLORIDE 0.9 % IV SOLN
INTRAVENOUS | Status: DC
  Administered 2015-08-11: 17:00:00 via INTRAVENOUS

## 2015-08-11 NOTE — Clinical Social Work Note (Signed)
Clinical Social Work Assessment  Patient Details  Name: Kyle Parrish MRN: [id number] Date of Birth: October 15, 1912  Date of referral:  08/11/15               Reason for consult:  Facility Placement                Permission sought to share information with:  Family Supports Permission granted to share information::  Yes, Verbal Permission Granted  Name::     Mardene Celeste  Agency::  [facility]  Relationship::  dtr  Contact Information:     Housing/Transportation Living arrangements for the past 2 months:  [place] of Information:  Adult Children Patient Interpreter Needed:  None Criminal Activity/Legal Involvement Pertinent to Current Situation/Hospitalization:  No - Comment as needed Significant Relationships:  Adult Children Lives with:  Self Do you feel safe going back to the place where you live?  No Need for family participation in patient care:  Yes (Comment) (decision making)  Care giving concerns:  Pt lives at home alone- per dtr he is rarely alone at home and almost constantly has family there with him.  Pt currently with increased weakness, however, and family unable to provide sufficient assistance at home.   Social Worker assessment / plan:  CSW spoke with pt and pt dtr at bedside concerning PT recommendation for SNF.  Pt dtr reports pt when to CIR last time he was admitted and has never been to SNF- CSW discussed SNF and SNF referral process.  Pt hard of hearing and did not understand all of conversation- dtr would repeat loudly that rehab was being discussed- pt prefers to go home but was not argumentative with recommendations and dtrs preference for rehab first.  Employment status:  Retired Forensic scientist:  Medicare PT Recommendations:  [place] / Referral to community resources:  [place]  Patient/Family's Response to care:  Pt and dtr agreeable to short term SNF stay prior to pt return  home.  Patient/Family's Understanding of and Emotional Response to Diagnosis, Current Treatment, and Prognosis:  Pt dtr has good understanding of pt condition and is hopeful pt can return home after a very short SNF stay.  Emotional Assessment Appearance:  Appears stated age Attitude/Demeanor/Rapport:    Affect (typically observed):  Appropriate, Defensive Orientation:  Oriented to Self, Oriented to Place Alcohol / Substance use:  Not Applicable Psych involvement (Current and /or in the community):  No (Comment)  Discharge Needs  Concerns to be addressed:  Home Safety Concerns, Care Coordination Readmission within the last 30 days:  No Current discharge risk:  Physical Impairment Barriers to Discharge:  Continued Medical Work up   [employer], LCSW 08/11/2015, 11:38 AM

## 2015-08-11 NOTE — Progress Notes (Signed)
Kyle Parrish [id number] DOB: 21-Jan-1913 DOA: 08/09/2015 PCP: Wyatt Haste, MD  Brief narrative: 82 y/o ? Dm ty II-recent diagnosis-has been mabnaged on ACtos for~ 12 yrs per daughter Implemented Metformin 500 Xr/Toujeo [glargine] 6 U given CGB ~ 400 range ~ 1 mo ago Htn CAD-AMI and Congestive HF ~ 09/2001--Stent to  Distal LAD 3.5 x 13 Hepacoat stent Isch CM HLD Gout Cecal adenoCa -Cecal mass on biopsy 11/06/01 PUD  Admit to [facility] Ed after being found "down" by s-i-l. With CBg 22 Daughter sees patient q pm, checks sugars bid   Past medical history-As per Problem list Chart reviewed as below- reviewed  Consultants:  cardiology  Procedures:  none  Antibiotics:  none   Subjective   Alert pleasant Very HOH Eating some No overt pain Large BM earlier today No falls worked with therapy today   Objective    Interim History:   Telemetry:    Objective: Filed Vitals:   08/10/15 1353 08/10/15 2044 08/11/15 0016 08/11/15 0452  BP: 107/50 106/51 104/49 107/66  Pulse: 74 78 73 76  Temp: 97.8 F (36.6 C) 97.6 F (36.4 C) 97.6 F (36.4 C) 97.6 F (36.4 C)  TempSrc: Oral Oral Oral   Resp: '18 18 18 18  '$ Height:      Weight:      SpO2: 98% 98% 96% 97%    Intake/Output Summary (Last 24 hours) at 08/11/15 1155 Last data filed at 08/11/15 0900  Gross per 24 hour  Intake   1071 ml  Output    375 ml  Net    696 ml    Exam:  General: eomi ncat, poor dentition, no pallor, R eye pupil irregular Cardiovascular: s1 s2 reg irreg, +M Respiratory: clear no added sound.  No rales no rhonchi Abdomen: soft nt nd no rebound  Skin: intact Neuro: intact-slow moving  Data Reviewed: Basic Metabolic Panel:  Recent Labs Lab 08/09/15 1657  08/10/15 0822 08/11/15 0928  NA 131*  --  132* 131*  K 6.0*  < > 4.6 4.4  CL 99*  --  102 105  CO2 22  --  21* 18*  GLUCOSE 132*  --  227* 164*  BUN 70*  --  63* 63*  CREATININE 3.43*  --  3.07* 3.08*   CALCIUM 9.2  --  8.5* 8.2*  < > = values in this interval not displayed. Liver Function Tests:  Recent Labs Lab 08/09/15 1657 08/10/15 0822 08/11/15 0928  AST 99* 73* 80*  ALT 39 36 37  ALKPHOS 301* 285* 281*  BILITOT 2.1* 1.2 1.3*  PROT 5.5* 5.0* 4.6*  ALBUMIN 2.4* 2.2* 2.0*   No results for input(s): LIPASE, AMYLASE in the last 168 hours. No results for input(s): AMMONIA in the last 168 hours. CBC:  Recent Labs Lab 08/09/15 1657 08/10/15 0822  WBC 8.8 6.1  NEUTROABS 7.1  --   HGB 10.8* 9.9*  HCT 32.2* 29.3*  MCV 91.7 89.9  PLT 151 143*   Cardiac Enzymes:  Recent Labs Lab 08/09/15 2004 08/10/15 0822 08/10/15 1544 08/10/15 1958  CKTOTAL  --  122  --   --   TROPONINI 0.13* 0.14* 0.14* 0.15*   BNP: Invalid input(s): POCBNP CBG:  Recent Labs Lab 08/10/15 1833 08/10/15 2042 08/11/15 0153 08/11/15 0444 08/11/15 0811  GLUCAP 82 96 119* 134* 114*    Recent Results (from the past 240 hour(s))  Urine culture     Status: Abnormal   Collection Time: 08/10/15  9:50 AM  Result Value Ref Range Status   Specimen Description URINE, CLEAN CATCH  Final   Special Requests NONE  Final   Culture MULTIPLE SPECIES PRESENT, SUGGEST RECOLLECTION (A)  Final   Report Status 08/11/2015 FINAL  Final     Studies:              All Imaging reviewed and is as per above notation   Scheduled Meds: Continuous Infusions:   Assessment/Plan:  1. Hypoglycemia-stop metformin forever-risky to use with Creat>1.5.  Lantus 6 u qpm if pm CBg >200.  3 U if above 150 as OP ? alk phos and AST/ALT-etiology unclear.  Ordered US abd pelvis to look at kidneys + Liver AKI/Hyperkalemia/mild met acidossis-improved.  Saline 75--->50 cc/hr-recheck labs am-hold Bicarb for now.  Might need to increase fluid rate.  Get Urine sodium and Urine OSM.  Get US renal.  Continue Flomax Atelectasis-unclear if PNA-no fever, no chills and oriented CBC wnl.  Hold Abx.  IS q2 while awake Afib/PPM-Chadscore  6/Isch CM-Continue rate control c Coreg as OP.  No digoxin--Given low nl BP.  On no meds at +.  Continue ASa 81 daily.   ? troponin is 2/2 to Ty II Mi/ demand ischmeia and renal disease Anemia-, Mild TCP-unclear etiology-get LDH and Hapto, Get Technologist smear HLd-statin held Hypothyroid-Synthroid GErd-pantoprazole d/c given renal insuff     Long d/w daughter PT to eval Likely d/c to SNF am if sugars stabilize  Verneita Griffes, MD  [facility] Pager [phone] 08/11/2015, 11:55 AM    LOS: 2 days

## 2015-08-11 NOTE — Evaluation (Signed)
Physical Therapy Evaluation Patient Details Name: Kyle Parrish MRN: [id number] DOB: 1912/10/23 Today's Date: 08/11/2015   History of Present Illness  Patient is a 82 y/o male with hx of CAD, colon ca, gout, NIDDM, Ischemic cardiomyopathy and Actinic Keratosis presents with hypoglycemia of 26 after being found down at home. Alsofound to have Hyperkalemia and Hyponatremia, and an elevated BUN/Cr.   Clinical Impression  Patient presents with hospital acquired weakness, impaired balance and overall mobility s/p above. PTA, pt Mod I with RW and caring for self at home. Pt reports not being able to tell when sugars are low. Pt now requires Min-Mod A for transfers and gait training due to weakness. Not safe to return home alone at this time as pt high fall risk. Would benefit from [facility] SNF to maximize independence and mobility prior to return home. Will follow acutely.     Follow Up Recommendations SNF;Supervision for mobility/OOB    Equipment Recommendations  None recommended by PT    Recommendations for Other Services OT consult     Precautions / Restrictions Precautions Precautions: Fall Restrictions Weight Bearing Restrictions: No      Mobility  Bed Mobility Overal bed mobility: Needs Assistance Bed Mobility: Supine to Sit     Supine to sit: Min assist;HOB elevated     General bed mobility comments: Cues for technique and to use rail for support. Min A to elevate trunk.  Transfers Overall transfer level: Needs assistance Equipment used: Rolling walker (2 wheeled) Transfers: Sit to/from Stand Sit to Stand: Min assist;Mod assist         General transfer comment: MOd A to boost from EOB with cues for technique. Min A to stand from chair x2 with posterior bias in standing. Transferred to chair post ambulation bout.  Ambulation/Gait Ambulation/Gait assistance: Min assist Ambulation Distance (Feet): 40 Feet (+ 40') Assistive device: Rolling walker (2 wheeled) Gait  Pattern/deviations: Step-through pattern;Decreased stride length;Trunk flexed Gait velocity: decreased   General Gait Details: Slow, unsteady gait with bil knee instability. 1 seated rest break due to fatigue/weakness.   Stairs            Wheelchair Mobility    Modified Rankin (Stroke Patients Only)       Balance Overall balance assessment: Needs assistance;History of Falls Sitting-balance support: Feet supported;No upper extremity supported Sitting balance-Leahy Scale: Fair     Standing balance support: During functional activity;Bilateral upper extremity supported Standing balance-Leahy Scale: Poor Standing balance comment: Reliant on BUEs for support.                              Pertinent Vitals/Pain Pain Assessment: No/denies pain    Home Living Family/patient expects to be discharged to:: Private residence Living Arrangements: Alone Available Help at Discharge: Available PRN/intermittently;Family (daughter checks on him 4-5 times /day) Type of Home: Apartment Home Access: Level entry     Home Layout: One level Home Equipment: Walker - 2 wheels;Cane - single point;Shower seat      Prior Function Level of Independence: Independent with assistive device(s)         Comments: Uses RW PTA. Recently got HHPT and aide- started last week.     Hand Dominance        Extremity/Trunk Assessment   Upper Extremity Assessment: Defer to OT evaluation           Lower Extremity Assessment: Generalized weakness         Communication  Communication: HOH  Cognition Arousal/Alertness: Awake/alert Behavior During Therapy: WFL for tasks assessed/performed Overall Cognitive Status: Within Functional Limits for tasks assessed                      General Comments General comments (skin integrity, edema, etc.): Daughter present in room during session.    Exercises        Assessment/Plan    PT Assessment Patient needs continued  PT services  PT Diagnosis Difficulty walking;Generalized weakness   PT Problem List Decreased strength;Cardiopulmonary status limiting activity;Decreased activity tolerance;Decreased balance;Decreased mobility;Decreased safety awareness  PT Treatment Interventions Balance training;Gait training;Functional mobility training;Therapeutic activities;Therapeutic exercise;Patient/family education   PT Goals (Current goals can be found in the Care Plan section) Acute Rehab PT Goals Patient Stated Goal: to get out of here PT Goal Formulation: With patient Time For Goal Achievement: 08/25/15 Potential to Achieve Goals: Good    Frequency Min 2X/week   Barriers to discharge Decreased caregiver support lives alone    Co-evaluation               End of Session Equipment Utilized During Treatment: Gait belt Activity Tolerance: Patient tolerated treatment well Patient left: in chair;with call bell/phone within reach;with chair alarm set;with family/visitor present Nurse Communication: Mobility status         Time: 1030-1103 PT Time Calculation (min) (ACUTE ONLY): 33 min   Charges:   PT Evaluation $PT Eval Moderate Complexity: 1 Procedure PT Treatments $Gait Training: 8-22 mins   PT G Codes:        [name] A [employer] Harwick 08/11/2015, 12:45 PM Wray Kearns, [place], DPT [phone]

## 2015-08-11 NOTE — Progress Notes (Signed)
Patient's daughter, Chong Sicilian [phone], prefers SNF in [facility] area. Mention specifically [place] [facility], [place] or [facility]. She will be working tomorrow 30th please let her know SNF offers. Thx

## 2015-08-11 NOTE — NC FL2 (Signed)
[place] LEVEL OF CARE SCREENING TOOL     IDENTIFICATION  Patient Name: Kyle Parrish Birthdate: 04/23/1912 Sex: male Admission Date (Current Location): 08/09/2015  [facility] and [place] Number:  Herbalist and Address:  [facility] [place]. [facility], [place] [address] [place], [place] [address]      Provider Number: [id number]  Attending Physician Name and Address:  Nita Sells, MD  Relative Name and Phone Number:       Current Level of Care: Hospital Recommended Level of Care: [place] Prior Approval Number:    Date Approved/Denied:   PASRR Number: [id number]  Discharge Plan: SNF    Current Diagnoses: Patient Active Problem List   Diagnosis Date Noted  . AKI (acute kidney injury) ([place]) 08/09/2015  . Hypoglycemia 08/09/2015  . Elevated troponin 08/09/2015  . Hyperkalemia 08/09/2015  . Hyponatremia 08/09/2015  . Arthritis 05/02/2013  . History of Ischemic cardiomyopathy   . Hyperlipidemia associated with type 2 diabetes mellitus ([place]) 04/06/2011  . Type 2 diabetes with complication ([place]) 99991111  . Hypertension associated with diabetes ([place]) 08/03/2010  . Hypothyroid 08/03/2010  . History of colon cancer 08/03/2010  . Tinea pedis 07/09/2010  . Coronary artery disease, with PCI of the LAD; following an anterior MI treated medically. 09/02/2001    Orientation RESPIRATION BLADDER Height & Weight     Self, Place  Normal Incontinent Weight: 129 lb 3 oz (58.6 kg) Height:  5\' 6"  (167.6 cm)  BEHAVIORAL SYMPTOMS/MOOD NEUROLOGICAL BOWEL NUTRITION STATUS      Incontinent Diet (cardiac)  AMBULATORY STATUS COMMUNICATION OF NEEDS Skin     Verbally Normal                       Personal Care Assistance Level of Assistance  Bathing, Dressing           Functional Limitations Info             SPECIAL CARE FACTORS FREQUENCY  PT (By licensed PT), OT (By licensed OT)     PT Frequency: 5/wk OT  Frequency: 5/wk            Contractures      Additional Factors Info  Code Status, Allergies, Insulin Sliding Scale Code Status Info: FULL Allergies Info: NKA   Insulin Sliding Scale Info: 6/day       Current Medications (08/11/2015):  This is the current hospital active medication list Current Facility-Administered Medications  Medication Dose Route Frequency Provider Last Rate Last Dose  . aspirin EC tablet 325 mg  325 mg Oral Daily Theressa Millard, MD   325 mg at 08/11/15 0955  . dorzolamide-timolol (COSOPT) 22.3-6.8 MG/ML ophthalmic solution 1 drop  1 drop Both Eyes BID Theressa Millard, MD   1 drop at 08/11/15 0958  . enoxaparin (LOVENOX) injection 30 mg  30 mg Subcutaneous Q24H Theressa Millard, MD   30 mg at 08/11/15 0956  . HYDROmorphone (DILAUDID) injection 0.5-1 mg  0.5-1 mg Intravenous Q3H PRN Theressa Millard, MD      . insulin aspart (novoLOG) injection 0-9 Units  0-9 Units Subcutaneous Q4H Theressa Millard, MD   1 Units at 08/11/15 0447  . latanoprost (XALATAN) 0.005 % ophthalmic solution 1 drop  1 drop Both Eyes QHS Theressa Millard, MD   1 drop at 08/10/15 2145  . levothyroxine (SYNTHROID, LEVOTHROID) tablet 88 mcg  88 mcg Oral QAC breakfast Theressa Millard, MD  88 mcg at 08/11/15 0955  . ondansetron (ZOFRAN) tablet 4 mg  4 mg Oral Q6H PRN Theressa Millard, MD       Or  . ondansetron (ZOFRAN) injection 4 mg  4 mg Intravenous Q6H PRN Harvette Evonnie Dawes, MD      . oxyCODONE (Oxy IR/ROXICODONE) immediate release tablet 5 mg  5 mg Oral Q4H PRN Theressa Millard, MD      . Derrill Memo ON 08/12/2015] pantoprazole (PROTONIX) EC tablet 20 mg  20 mg Oral Daily Nita Sells, MD      . simvastatin (ZOCOR) tablet 40 mg  40 mg Oral q1800 Theressa Millard, MD   40 mg at 08/10/15 1741  . tamsulosin (FLOMAX) capsule 0.4 mg  0.4 mg Oral Daily Theressa Millard, MD   0.4 mg at 08/11/15 [id number]     Discharge Medications: Please see discharge summary for a list  of discharge medications.  Relevant Imaging Results:  Relevant Lab Results:   Additional Information SS#: [id number]  Cranford Mon, [place]

## 2015-08-11 NOTE — Progress Notes (Signed)
CSW consulted for Advanced Directives- spoke with pt dtr who already has paperwork for advanced directives which she has filled out but left at home.  Dtr will bring in advanced directive paperwork tomorrow (5/30)- CSW informed dtr that she can notify RN to page chaplain service when paperwork is here tomorrow and they can assist with getting paperwork notorized.  No further CSW needs at this time- please reconsult if needed  Domenica Reamer, [place] Social Worker [phone]

## 2015-08-11 NOTE — Progress Notes (Signed)
Initial Nutrition Assessment  DOCUMENTATION CODES:   Not applicable  INTERVENTION:   -Ensure Enlive po BID, each supplement provides 350 kcal and 20 grams of protein  NUTRITION DIAGNOSIS:   Inadequate oral intake related to poor appetite as evidenced by meal completion < 50%, per patient/family report.  GOAL:   Patient will meet greater than or equal to 90% of their needs  MONITOR:   PO intake, Supplement acceptance, Labs, Weight trends, Skin, I & O's  REASON FOR ASSESSMENT:   Consult Assessment of nutrition requirement/status  ASSESSMENT:   Kyle Parrish is a 82 y.o. male with a history of DM2, HTN, CAD, Ischemic Cardiomyopathy, Hyperlipidemia, Actinic Keratosis, and Gout who presents to the ED with Hypoglycemia to 26.  Pt admitted with hypoglycemia.   Hx obtained mainly from pt's daughter at bedside. She reports pt's appetite has been declining over the past 6 months. Family is very supportive of pt and assists with meals, monitoring of CBGS, and medications. Per pt daughter, pt generally consumes about 50% of meals, which consist of foods such as chicken and meat loaf with starch and vegetable. Meal completion 10-15%.   Per conversation with daughter, she feels that hypoglycemia is likely medication related. She shares that pt was on actos for approximately 13 years and glycemic levels were managed quite well. However, pt was changes to metformin and tuojeo and pt would experience drastic highs and lows (from 23 to over 400).   Pt daughter also expressed frustration that she was never given a specific diet to follow and was told pt could not consume fruit or fruit juice. Briefly discussed importance of regular meal intake and portion control of carbohydrate sources. Also discussed that pt's diet was liberalized to regular to optimize meal intake; RD agrees with liberalization of diet given pt's advanced age.   Pt daughter very receptive to RD recommendations to improve  PO intake to prevent further hypoglycemic episodes; amenable to supplements. RD to order chocolate Ensure.   Wt hx reviewed. UBW around 150#. Noted pt has experienced a 10.4% wt loss over the past 4 months. This RD suspect weight loss is likely multi-factorial (suboptimal glycemic control and poor appetite).   Unable to complete Nutrition-Focused physical exam at this time. Pt was very agitated at time of visit. Pt daughter also shares pt pt is upset that he will be transferred to SNF at discharge.   Labs reviewed: Na: 131, CBGS: 114-134.   Diet Order:  Diet regular Room service appropriate?: Yes; Fluid consistency:: Thin  Skin:  Reviewed, no issues  Last BM:  08/07/15  Height:   Ht Readings from Last 1 Encounters:  08/09/15 5\' 6"  (1.676 m)    Weight:   Wt Readings from Last 1 Encounters:  08/09/15 129 lb 3 oz (58.6 kg)    Ideal Body Weight:  64.5 kg  BMI:  Body mass index is 20.86 kg/(m^2).  Estimated Nutritional Needs:   Kcal:  1500-1700  Protein:  65-80 grams  Fluid:  1.5-1.7 L  EDUCATION NEEDS:   Education needs addressed  Kyle Parrish, RD, LDN, CDE Pager: [phone] After hours Pager: [phone]

## 2015-08-12 ENCOUNTER — Inpatient Hospital Stay (HOSPITAL_COMMUNITY): Payer: Medicare Other

## 2015-08-12 LAB — CBC WITH DIFFERENTIAL/PLATELET
BASOS PCT: 0 %
Basophils Absolute: 0 10*3/uL (ref 0.0–0.1)
EOS PCT: 0 %
Eosinophils Absolute: 0 10*3/uL (ref 0.0–0.7)
HCT: 27.6 % — ABNORMAL LOW (ref 39.0–52.0)
HEMOGLOBIN: 9.2 g/dL — AB (ref 13.0–17.0)
LYMPHS PCT: 13 %
Lymphs Abs: 1.2 10*3/uL (ref 0.7–4.0)
MCH: 30.8 pg (ref 26.0–34.0)
MCHC: 33.3 g/dL (ref 30.0–36.0)
MCV: 92.3 fL (ref 78.0–100.0)
MONOS PCT: 8 %
Monocytes Absolute: 0.8 10*3/uL (ref 0.1–1.0)
NEUTROS PCT: 79 %
Neutro Abs: 7.5 10*3/uL (ref 1.7–7.7)
PLATELETS: 118 10*3/uL — AB (ref 150–400)
RBC: 2.99 MIL/uL — AB (ref 4.22–5.81)
RDW: 17.6 % — ABNORMAL HIGH (ref 11.5–15.5)
WBC: 9.5 10*3/uL (ref 4.0–10.5)

## 2015-08-12 LAB — COMPREHENSIVE METABOLIC PANEL
ALT: 47 U/L (ref 17–63)
ANION GAP: 9 (ref 5–15)
AST: 102 U/L — ABNORMAL HIGH (ref 15–41)
Albumin: 2 g/dL — ABNORMAL LOW (ref 3.5–5.0)
Alkaline Phosphatase: 314 U/L — ABNORMAL HIGH (ref 38–126)
BUN: 69 mg/dL — ABNORMAL HIGH (ref 6–20)
CHLORIDE: 106 mmol/L (ref 101–111)
CO2: 18 mmol/L — AB (ref 22–32)
Calcium: 8.3 mg/dL — ABNORMAL LOW (ref 8.9–10.3)
Creatinine, Ser: 3.31 mg/dL — ABNORMAL HIGH (ref 0.61–1.24)
GFR, EST AFRICAN AMERICAN: 16 mL/min — AB (ref >60)
GFR, EST NON AFRICAN AMERICAN: 14 mL/min — AB (ref >60)
Glucose, Bld: 97 mg/dL (ref 65–99)
POTASSIUM: 4.3 mmol/L (ref 3.5–5.1)
SODIUM: 133 mmol/L — AB (ref 135–145)
Total Bilirubin: 1.4 mg/dL — ABNORMAL HIGH (ref 0.3–1.2)
Total Protein: 4.7 g/dL — ABNORMAL LOW (ref 6.5–8.1)

## 2015-08-12 LAB — GLUCOSE, CAPILLARY
GLUCOSE-CAPILLARY: 106 mg/dL — AB (ref 65–99)
GLUCOSE-CAPILLARY: 140 mg/dL — AB (ref 65–99)
GLUCOSE-CAPILLARY: 86 mg/dL (ref 65–99)
GLUCOSE-CAPILLARY: 95 mg/dL (ref 65–99)
Glucose-Capillary: 107 mg/dL — ABNORMAL HIGH (ref 65–99)
Glucose-Capillary: 136 mg/dL — ABNORMAL HIGH (ref 65–99)
Glucose-Capillary: 112 mg/dL — ABNORMAL HIGH (ref 65–99)

## 2015-08-12 LAB — HEMOGLOBIN A1C
HEMOGLOBIN A1C: 8.5 % — AB (ref 4.8–5.6)
MEAN PLASMA GLUCOSE: 197 mg/dL

## 2015-08-12 LAB — HAPTOGLOBIN: Haptoglobin: 144 mg/dL (ref 34–200)

## 2015-08-12 MED ORDER — SODIUM BICARBONATE 650 MG PO TABS: 650.0000 mg | ORAL_TABLET | Freq: Every day | ORAL | Status: DC

## 2015-08-12 MED ORDER — INSULIN GLARGINE 300 UNIT/ML ~~LOC~~ SOPN: 3.0000 [IU] | PEN_INJECTOR | Freq: Every day | SUBCUTANEOUS | Status: DC

## 2015-08-12 MED ORDER — SODIUM CHLORIDE 0.9 % IV SOLN
INTRAVENOUS | Status: AC
Start: 2015-08-12 — End: 2015-08-13
  Administered 2015-08-12 – 2015-08-13 (×2): via INTRAVENOUS

## 2015-08-12 MED ORDER — DEXTROSE 5 % IV SOLN: INTRAVENOUS | Status: DC

## 2015-08-12 MED ORDER — SODIUM BICARBONATE 650 MG PO TABS
650.0000 mg | ORAL_TABLET | Freq: Every day | ORAL | Status: DC
  Administered 2015-08-12 – 2015-08-13 (×2): 650 mg via ORAL
  Filled 2015-08-12 (×2): qty 1

## 2015-08-12 NOTE — Care Management Note (Signed)
Case Management Note  Patient Details  Name: LUIE LEANG MRN: [place]:[id number] Date of Birth: May 21, 1912  Subjective/Objective:                 Patient admitted for hypoglycemia. [place] at ALF and was receiving HH through [facility]. Is rec to go to SNF at DC, MD following hgb and Cr today. Anticipate DC to SNF in 1-2 days.   Action/Plan:   Expected Discharge Date:  08/12/15               Expected Discharge Plan:  Skilled Nursing Facility  In-House Referral:  Clinical Social Work  Discharge planning Services  CM Consult  Post Acute Care Choice:  NA Choice offered to:     DME Arranged:    DME Agency:     HH Arranged:    HH Agency:     Status of Service:  In process, will continue to follow  Medicare Important Message Given:  Yes Date Medicare IM Given:    Medicare IM give by:    Date Additional Medicare IM Given:    Additional Medicare Important Message give by:     If discussed at [place] of Stay Meetings, dates discussed:    Additional Comments:  Carles Collet, RN 08/12/2015, 12:39 PM

## 2015-08-12 NOTE — Telephone Encounter (Signed)
Confirm 2 x a week for 6 week  3 in one toilet  Per Andrey Campanile

## 2015-08-12 NOTE — Care Management Important Message (Signed)
Important Message  Patient Details  Name: Kyle Parrish MRN: [place]:[id number] Date of Birth: 1912-04-10   Medicare Important Message Given:  Yes    Loann Quill 08/12/2015, 10:48 AM

## 2015-08-12 NOTE — Progress Notes (Signed)
CSW provided bed offers to pt dtr- chooses [facility] when pt is stable  Per MD possible DC 1-2 days- [place] informed and will follow for admission  Domenica Reamer, [place] Social Worker [phone]

## 2015-08-12 NOTE — Consult Note (Signed)
   [facility] CM Inpatient Consult   08/12/2015  Kyle Parrish [birth year] [place]:[id number]  Patient screened for potential [employer] Management services. Patient is eligible for [facility]. Electronic medical record reveals patient's discharge plan is  A skilled nursing facility stay. This is the patient 's first admission.  [facility] Care Management services not appropriate at this time for community care management.  If patient's post hospital needs change please place a [facility] Care Management consult. For questions please contact:   Natividad Brood, RN BSN [facility] Liaison  [phone] business mobile phone Toll free office [phone]

## 2015-08-12 NOTE — Progress Notes (Signed)
STEPHANIE MCGLONE FXT:[id number] DOB: 02/13/1913 DOA: 08/09/2015 PCP: Wyatt Haste, MD  Brief narrative: 82 y/o ? Dm ty II-recent diagnosis-has been mabnaged on ACtos for~ 12 yrs per daughter Implemented Metformin 500 Xr/Toujeo [glargine] 6 U given CGB ~ 400 range ~ 1 mo ago Htn CAD-AMI and Congestive HF ~ 09/2001--Stent to  Distal LAD 3.5 x 13 Hepacoat stent Isch CM HLD Gout Cecal adenoCa -Cecal mass on biopsy 11/06/01 PUD  Admit to [facility] after being found "down" by s-i-l. With CBg 26 Daughter sees patient q pm, checks sugars bid  Found also to have elevated liver enzymes --with trend upwards Getting Abd [place] today and awaiting resolution of acute kidney injury prior to evantual d/c to [facility] in 48 hour sor so  Past medical history-As per Problem list Chart reviewed as below- reviewed  Consultants:  cardiology  Procedures:  none  Antibiotics:  none   Subjective   Alert pleasant NO specific c/o Eating fair No pain No n/v No cp    Objective    Interim History:   Telemetry:    Objective: Filed Vitals:   08/11/15 2010 08/11/15 2058 08/12/15 0203 08/12/15 0642  BP: 111/56 112/54 115/46 102/42  Pulse: 70 70 78 87  Temp: 97.8 F (36.6 C) 98.4 F (36.9 C) 98.5 F (36.9 C) 98.3 F (36.8 C)  TempSrc:   Oral Oral  Resp: _0 Height:      Weight:   63 kg (138 lb 14.2 oz)   SpO2: 98% 98% 95% 96%    Intake/Output Summary (Last 24 hours) at 08/12/15 1305 Last data filed at 08/12/15 1106  Gross per 24 hour  Intake      0 ml  Output    200 ml  Net   -200 ml    Exam:  General: eomi ncat, poor dentition, no pallor, R eye pupil irregular Cardiovascular: s1 s2 reg irreg, +M, no JVD Respiratory: clear no added sound.  No rales no rhonchi Abdomen: soft nt nd no rebound  Skin: intact, no LE edema Neuro: intact-slow moving  Data Reviewed: Basic Metabolic Panel:  Recent Labs Lab 08/09/15 1657  08/10/15 0822 08/11/15 0928  08/12/15 1121  NA 131*  --  132* 131* 133*  K 6.0*  < > 4.6 4.4 4.3  CL 99*  --  102 105 106  CO2 22  --  21* 18* 18*  GLUCOSE 132*  --  227* 164* 97  BUN 70*  --  63* 63* 69*  CREATININE 3.43*  --  3.07* 3.08* 3.31*  CALCIUM 9.2  --  8.5* 8.2* 8.3*  < > = values in this interval not displayed. Liver Function Tests:  Recent Labs Lab 08/09/15 1657 08/10/15 0822 08/11/15 0928 08/12/15 1121  AST 99* 73* 80* 102*  ALT 39 36 37 47  ALKPHOS 301* 285* 281* 314*  BILITOT 2.1* 1.2 1.3* 1.4*  PROT 5.5* 5.0* 4.6* 4.7*  ALBUMIN 2.4* 2.2* 2.0* 2.0*   No results for input(s): LIPASE, AMYLASE in the last 168 hours. No results for input(s): AMMONIA in the last 168 hours. CBC:  Recent Labs Lab 08/09/15 1657 08/10/15 0822 08/12/15 1121  WBC 8.8 6.1 9.5  NEUTROABS 7.1  --  7.5  HGB 10.8* 9.9* 9.2*  HCT 32.2* 29.3* 27.6*  MCV 91.7 89.9 92.3  PLT 151 143* 118*   Cardiac Enzymes:  Recent Labs Lab 08/09/15 2004 08/10/15 0822 08/10/15 1544 08/10/15 1958  CKTOTAL  --  122  --   --  TROPONINI 0.13* 0.14* 0.14* 0.15*   BNP: Invalid input(s): POCBNP CBG:  Recent Labs Lab 08/12/15 0002 08/12/15 0645 08/12/15 0833 08/12/15 1123 08/12/15 1226  GLUCAP 106* 140* 95 86 107*    Recent Results (from the past 240 hour(s))  Urine culture     Status: Abnormal   Collection Time: 08/10/15  9:50 AM  Result Value Ref Range Status   Specimen Description URINE, CLEAN CATCH  Final   Special Requests NONE  Final   Culture MULTIPLE SPECIES PRESENT, SUGGEST RECOLLECTION (A)  Final   Report Status 08/11/2015 FINAL  Final     Studies:              All Imaging reviewed and is as per above notation   Scheduled Meds: Continuous Infusions:   Assessment/Plan:  1. Hypoglycemia-stop metformin forever-risky to use with Creat>1.5.  Lantus 6 u qpm if pm CBg >200.  3 U if above 150 as OP ? alk phos and AST/ALT-etiology unclear. Still waiting [place] abd/pelvis to look at kidneys + Liver??.  AKI  2/2 to hypotension and hyperperfusion/Hyperkalemia/mild met acidossis-stable. Start low dose bicarb Bicarb 650 qd 08/12/15.   Increase fluid rate 125 cc/hr.  Rpt Urine sodium and Urine OSM.  Await US renal.  Continue Flomax, place INDWELLING foley--I curbsided Nephrologist Dr. Jimmy Footman for opinion re: work-up.  No additional recs Atelectasis-unclear if PNA-no fever, no chills and oriented CBC wnl.  Hold Abx.  IS q2 while awake--CXR 2 vw ordered for 5/31 for comparison Afib/PPM-Chadscore 6/Isch CM-Continue rate control c Coreg as OP.  No digoxin--Given low nl BP.  On no meds at +.  Continue ASa 81 daily.   ? troponin is 2/2 to Ty II Mi/ demand ischmeia and renal disease Anemia-, Mild TCP-unclear etiology- LDH and Hapto,  HLd-statin held Hypothyroid-Synthroid GERD-pantoprazole d/c given renal insuff     Long d/w daughter on telephone PT to eval Likely d/c to SNF 48 hrs NOT READY for d/c Creatinine still ? , work up for liver enzymes are pending  Verneita Griffes, MD  [facility] Pager [phone] 08/12/2015, 1:05 PM    LOS: 3 days

## 2015-08-12 NOTE — Progress Notes (Signed)
PT Cancellation Note  Patient Details Name: Kyle Parrish MRN: [place]:[id number] DOB: 1912-04-27   Cancelled Treatment:     ABD Ultrasound out of room this afternoon   [name] 08/12/2015, 1:51 PM

## 2015-08-13 DIAGNOSIS — E1159 Type 2 diabetes mellitus with other circulatory complications: Secondary | ICD-10-CM

## 2015-08-13 DIAGNOSIS — N183 Chronic kidney disease, stage 3 unspecified: Secondary | ICD-10-CM | POA: Diagnosis present

## 2015-08-13 DIAGNOSIS — I10 Essential (primary) hypertension: Secondary | ICD-10-CM

## 2015-08-13 DIAGNOSIS — D696 Thrombocytopenia, unspecified: Secondary | ICD-10-CM

## 2015-08-13 DIAGNOSIS — E118 Type 2 diabetes mellitus with unspecified complications: Secondary | ICD-10-CM

## 2015-08-13 DIAGNOSIS — K7460 Unspecified cirrhosis of liver: Secondary | ICD-10-CM | POA: Diagnosis present

## 2015-08-13 DIAGNOSIS — E875 Hyperkalemia: Secondary | ICD-10-CM

## 2015-08-13 DIAGNOSIS — N179 Acute kidney failure, unspecified: Secondary | ICD-10-CM

## 2015-08-13 DIAGNOSIS — K7469 Other cirrhosis of liver: Secondary | ICD-10-CM

## 2015-08-13 DIAGNOSIS — Z794 Long term (current) use of insulin: Secondary | ICD-10-CM

## 2015-08-13 LAB — COMPREHENSIVE METABOLIC PANEL
ALBUMIN: 2 g/dL — AB (ref 3.5–5.0)
ALT: 48 U/L (ref 17–63)
AST: 95 U/L — ABNORMAL HIGH (ref 15–41)
Alkaline Phosphatase: 293 U/L — ABNORMAL HIGH (ref 38–126)
Anion gap: 10 (ref 5–15)
BUN: 72 mg/dL — ABNORMAL HIGH (ref 6–20)
CALCIUM: 8.5 mg/dL — AB (ref 8.9–10.3)
CHLORIDE: 107 mmol/L (ref 101–111)
CO2: 18 mmol/L — AB (ref 22–32)
CREATININE: 3.32 mg/dL — AB (ref 0.61–1.24)
GFR calc Af Amer: 16 mL/min — ABNORMAL LOW (ref >60)
GFR calc non Af Amer: 14 mL/min — ABNORMAL LOW (ref >60)
GLUCOSE: 110 mg/dL — AB (ref 65–99)
Potassium: 4.4 mmol/L (ref 3.5–5.1)
Sodium: 135 mmol/L (ref 135–145)
Total Bilirubin: 1.4 mg/dL — ABNORMAL HIGH (ref 0.3–1.2)
Total Protein: 4.7 g/dL — ABNORMAL LOW (ref 6.5–8.1)

## 2015-08-13 LAB — PROTIME-INR
INR: 1.23 (ref 0.00–1.49)
PROTHROMBIN TIME: 15.7 s — AB (ref 11.6–15.2)

## 2015-08-13 LAB — GLUCOSE, CAPILLARY
GLUCOSE-CAPILLARY: 117 mg/dL — AB (ref 65–99)
Glucose-Capillary: 154 mg/dL — ABNORMAL HIGH (ref 65–99)
Glucose-Capillary: 156 mg/dL — ABNORMAL HIGH (ref 65–99)
Glucose-Capillary: 81 mg/dL (ref 65–99)

## 2015-08-13 LAB — RENAL FUNCTION PANEL
ALBUMIN: 2 g/dL — AB (ref 3.5–5.0)
ANION GAP: 10 (ref 5–15)
BUN: 71 mg/dL — ABNORMAL HIGH (ref 6–20)
CALCIUM: 8.5 mg/dL — AB (ref 8.9–10.3)
CO2: 18 mmol/L — AB (ref 22–32)
Chloride: 108 mmol/L (ref 101–111)
Creatinine, Ser: 3.28 mg/dL — ABNORMAL HIGH (ref 0.61–1.24)
GFR calc non Af Amer: 14 mL/min — ABNORMAL LOW (ref >60)
GFR, EST AFRICAN AMERICAN: 16 mL/min — AB (ref >60)
GLUCOSE: 107 mg/dL — AB (ref 65–99)
PHOSPHORUS: 3.2 mg/dL (ref 2.5–4.6)
Potassium: 4.4 mmol/L (ref 3.5–5.1)
SODIUM: 136 mmol/L (ref 135–145)

## 2015-08-13 LAB — CREATININE, URINE, RANDOM: CREATININE, URINE: 126.71 mg/dL

## 2015-08-13 LAB — DIGOXIN LEVEL: Digoxin Level: <0.2 ng/mL — ABNORMAL LOW (ref 0.8–2.0)

## 2015-08-13 LAB — SODIUM, URINE, RANDOM: Sodium, Ur: 15 mmol/L

## 2015-08-13 MED ORDER — INSULIN ASPART 100 UNIT/ML ~~LOC~~ SOLN: 0.0000 [IU] | Freq: Three times a day (TID) | SUBCUTANEOUS | Status: DC

## 2015-08-13 MED ORDER — ENSURE ENLIVE PO LIQD: 237.0000 mL | Freq: Two times a day (BID) | ORAL | Status: DC

## 2015-08-13 NOTE — Progress Notes (Signed)
Report called to [facility].  Patient discharged with all belongings.

## 2015-08-13 NOTE — Discharge Summary (Signed)
Physician Discharge Summary  Kyle Parrish [id number] DOB: 09-Jun-1912 DOA: 08/09/2015  PCP: Wyatt Haste, MD  Admit date: 08/09/2015 Discharge date: 08/13/2015  Time spent: 35 minutes  Recommendations for Outpatient Follow-up:  #1 Discharged to skilled nursing facility. #2 Please check renal function in 2-3 days. Monitor CBG with meals closely. If CBG persistently elevated, may start him back on Actos. #3 check platelets and LFTs within one week.  Discharge Diagnoses:  Principal Problem:   Hypoglycemia   Active Problems:   Acute renal failure superimposed on stage 3 chronic kidney disease (HCC)   Type 2 diabetes with complication (HCC)   Hypertension associated with diabetes ([place])   Coronary artery disease, with PCI of the LAD; following an anterior MI treated medically.   Hypothyroid   Hyperlipidemia associated with type 2 diabetes mellitus (HCC)   History of Ischemic cardiomyopathy   Elevated troponin   Hyperkalemia   Hyponatremia   Cirrhosis of liver (HCC)   Thrombocytopenia ([place])   Discharge Condition: Fair  Diet recommendation: Diabetic/heart healthy  CODE STATUS: DO NOT RESUSCITATE  Family communication: Discussed with Daughter on the phone.  Filed Weights   08/09/15 2023 08/12/15 0203 08/13/15 0500  Weight: 58.6 kg (129 lb 3 oz) 63 kg (138 lb 14.2 oz) 61.281 kg (135 lb 1.6 oz)    History of present illness:  Please refer to admission H&P for details, in brief, 82 year old male who lives at home (has a caregiver most of the time) with history of type 2 diabetes mellitus, was on Actos for over 12 years and recently started on metformin 500 mg daily along with Tuojeo  since his CBG was persistently elevated in the range of 400s one month back, (A1c of 8.5) , hypertension, CAD with ischemic cardiomyopathy, acute MI with stenting to distal LAD in 09/2001, history of gout, hyperlipidemia, cecal adenocarcinoma and peptic ulcer disease was brought to the  ED by his family after he was found down on the floor by his son-in-law. CBG was 23. He was also found to have acute on chronic kidney disease along with elevated liver enzymes. Admitted to hospitalist service for further management.  Hospital Course:  Hypoglycemia I suspect this is combination of recently started tuojeo and metformin for his uncontrolled blood glucose with underlying worsened renal function. All his home medications were discontinued. CBG has remained stable between 80-120. Being monitored on a just a sliding scale coverage only. Discontinued Actos and Tuojeo upon discharge. Should not be on metformin given his underlying worsened renal function. I will discharge him on sliding scale coverage. Recommend monitoring his CBG closely to avoid hypoglycemic episodes.  Acute on chronic kidney disease stage III Suspect this is both prerenal with hypotension and hypovolemia and possible ATN. (Urine sodium of 14) Being on metformin further worsening his renal function. Given IV hydration with normal saline. UA shows some granular casts. Renal ultrasound shows mild right renal cortical thinning without hydronephrosis. Renal function plateaud with no further worsening. Avoid nephrotoxic agents. Please do not put him back on metformin. I will hold his colchicine as well. Renal function should be checked in the next 2-3 days.  CAD with history of ischemic cardiomyopathy Patient received IV fluids for hypovolemia. Continue Coreg. Continue aspirin.  A. fib with history of PPM CHADS2 vasc score of 6. Not on anticoagulation. Continue aspirin and Coreg. Will discontinue digoxin given hypertension and worsening renal function.  Elevated troponin Suspect demand ischemia with hypotension and hypoglycemia with worsened renal function. No further workup  per cardiology.  Anemia of chronic kidney disease Stable.  Thrombocytopenia Mild.? Due to cirrhosis. Follow-up as outpatient.  Cirrhosis of  liver New finding when workup for transaminitis. Ultrasound abdomen shows moderate cholelithiasis and cirrhotic changes with mild ascites. Given his age no further workup or intervention required. Daughter agrees with the plan.  BPH Added Flomax.  Goals of care discussed with daughter on the phone. She is his healthcare proxy and does not wish for any heroic measures (including mechanical ventilation, cardioversion etc.) if patient's condition deteriorates in the future. Agrees with making him DO NOT RESUSCITATE.    Procedures:  Ultrasound abdomen  Consultations:  Cardiology  Discharge Exam: Filed Vitals:   08/13/15 0028 08/13/15 0431  BP: 95/43 108/51  Pulse: 71 69  Temp: 97.9 F (36.6 C) 97.7 F (36.5 C)  Resp: 16 16    General: Elderly male not in distress HEENT: No pallor, moist mucosa, supple neck Chest: Clear to auscultation bilaterally CVS: S1 and S2 irregular, no murmurs rub or gallop GI: Soft, nondistended, nontender, bowel sounds present, Foley catheter (placed this admission and will be removed prior to discharge) Musculoskeletal: Warm, trace edema CNS: Alert and oriented, hard of hearing  Discharge Instructions    Current Discharge Medication List    START taking these medications   Details  feeding supplement, ENSURE ENLIVE, (ENSURE ENLIVE) LIQD Take 237 mLs by mouth 2 (two) times daily between meals. Qty: 237 mL, Refills: 12    insulin aspart (NOVOLOG) 100 UNIT/ML injection Inject 0-9 Units into the skin 3 (three) times daily with meals. Qty: 10 mL, Refills: 11    sodium bicarbonate 650 MG tablet Take 1 tablet (650 mg total) by mouth daily. Qty: 5 tablet, Refills: 0      CONTINUE these medications which have NOT CHANGED   Details  aspirin 81 MG tablet Take 81 mg by mouth daily.      Dorzolamide HCl-Timolol Mal PF 22.3-6.8 MG/ML SOLN Apply 1 drop to eye 2 (two) times daily.    folic acid (FOLVITE) 1 MG tablet Take 1 tablet (1 mg total) by  mouth daily. Qty: 90 tablet, Refills: 3    latanoprost (XALATAN) 0.005 % ophthalmic solution Place 1 drop into both eyes at bedtime.    levothyroxine (SYNTHROID, LEVOTHROID) 88 MCG tablet TAKE ONE TABLET BY MOUTH ONCE DAILY Qty: 30 tablet, Refills: 5    Multiple Vitamins-Minerals (MENS MULTIVITAMIN PLUS PO) Take by mouth.      tamsulosin (FLOMAX) 0.4 MG CAPS capsule TAKE ONE CAPSULE BY MOUTH ONCE DAILY. Qty: 90 capsule, Refills: 0    carvedilol (COREG) 6.25 MG tablet TAKE ONE TABLET BY MOUTH ONCE DAILY Qty: 30 tablet, Refills: 0      STOP taking these medications     COLCRYS 0.6 MG tablet      diclofenac sodium (VOLTAREN) 1 % GEL      fluticasone (CUTIVATE) 0.05 % cream      Insulin Pen Needle (PEN NEEDLES) 32G X 4 MM MISC      metFORMIN (GLUCOPHAGE-XR) 500 MG 24 hr tablet      omeprazole (PRILOSEC) 20 MG capsule      pioglitazone (ACTOS) 30 MG tablet      simvastatin (ZOCOR) 40 MG tablet      Insulin Glargine (TOUJEO SOLOSTAR) 300 UNIT/ML SOPN      digoxin (LANOXIN) 0.125 MG tablet      pioglitazone (ACTOS) 30 MG tablet        No Known Allergies Follow-up Information  Please follow up.   Why:  MD at SNF in 1 week       The results of significant diagnostics from this hospitalization (including imaging, microbiology, ancillary and laboratory) are listed below for reference.    Significant Diagnostic Studies: Dg Chest 2 View  08/10/2015  CLINICAL DATA:  Followup of pneumonia. EXAM: CHEST  2 VIEW COMPARISON:  08/09/2015 FINDINGS: Both lateral views are degraded posteriorly. Midline trachea. Mild cardiomegaly with a tortuous, calcified thoracic aorta. Although no pleural fluid is identified on the frontal radiograph, there is posterior increased opacity on the lateral view. This is not well localized on the frontal radiograph. No well-defined airspace opacity identified. IMPRESSION: Cardiomegaly without congestive failure. Increase in posterior opacity on the  lateral view. Not well localized on the frontal. Likely a combination of loculated pleural fluid and adjacent atelectasis or infection. Electronically Signed   By: Abigail Miyamoto M.D.   On: 08/10/2015 12:46   Dg Chest 2 View  08/09/2015  CLINICAL DATA:  Hypoglycemia.  Fatigue.  Fall. EXAM: CHEST  2 VIEW COMPARISON:  03/27/2008 chest radiograph. FINDINGS: Stable cardiomediastinal silhouette with normal heart size. No pneumothorax. No pleural effusion. There is patchy opacity overlying the lower thoracic spine on the lateral view, probably correlating with a right lower lobe on the frontal view. No pulmonary edema. IMPRESSION: Patchy right lower lobe opacity, nonspecific, cannot exclude a right lower lobe pneumonia. Recommend follow-up PA and lateral post treatment chest radiographs in 4-6 weeks. Electronically Signed   By: Ilona Sorrel M.D.   On: 08/09/2015 18:45   Ct Head Wo Contrast  08/09/2015  CLINICAL DATA:  Unresponsive EXAM: CT HEAD WITHOUT CONTRAST TECHNIQUE: Contiguous axial images were obtained from the base of the skull through the vertex without intravenous contrast. COMPARISON:  None. FINDINGS: Bony calvarium is intact. No gross soft tissue abnormality is seen. Paranasal sinuses are within normal limits. Diffuse atrophic changes are noted. No findings to suggest acute hemorrhage, acute infarction or space-occupying mass lesion are noted. IMPRESSION: Atrophic changes without acute abnormality. Electronically Signed   By: Inez Catalina M.D.   On: 08/09/2015 19:05   US Abdomen Complete  08/12/2015  CLINICAL DATA:  Acute renal insufficiency as well as elevated LFTs. Previous colectomy for colon cancer. EXAM: ABDOMEN ULTRASOUND COMPLETE COMPARISON:  08/15/2001 FINDINGS: Gallbladder: Moderate cholelithiasis as the largest stone measures 1 cm. No gallbladder wall thickening. No pericholecystic fluid and negative sonographic Murphy sign. Common bile duct: Diameter: 3.9 mm. Liver: There is diffuse  heterogeneity without definite focal mass. There is nodularity to the liver contour suggesting cirrhosis. There is mild ascites. IVC: No abnormality visualized. Pancreas: Visualized portion unremarkable. Spleen: Size and appearance within normal limits. Right Kidney: Length: 9.7 cm. Generalized cortical thinning. Echogenicity within normal limits. No mass or hydronephrosis visualized. Left Kidney: Length: 0.4 cm. Echogenicity within normal limits. No mass or hydronephrosis visualized. Abdominal aorta: Mild-to-moderate atherosclerotic disease without evidence of aneurysm. Other findings: Small bilateral pleural effusions. IMPRESSION: Moderate cholelithiasis without additional sonographic evidence to suggest cholecystitis. Findings suggesting cirrhosis with mild ascites. Small bilateral pleural effusions. Kidneys normal size without hydronephrosis. Mild right renal cortical thinning. Electronically Signed   By: Marin Olp M.D.   On: 08/12/2015 14:31    Microbiology: Recent Results (from the past 240 hour(s))  Urine culture     Status: Abnormal   Collection Time: 08/10/15  9:50 AM  Result Value Ref Range Status   Specimen Description URINE, CLEAN CATCH  Final   Special Requests NONE  Final   Culture MULTIPLE SPECIES PRESENT, SUGGEST RECOLLECTION (A)  Final   Report Status 08/11/2015 FINAL  Final     Labs: Basic Metabolic Panel:  Recent Labs Lab 08/10/15 0822 08/11/15 0928 08/12/15 1121 08/13/15 0647 08/13/15 0648  NA 132* 131* 133* 136 135  K 4.6 4.4 4.3 4.4 4.4  CL 102 105 106 108 107  CO2 21* 18* 18* 18* 18*  GLUCOSE 227* 164* 97 107* 110*  BUN 63* 63* 69* 71* 72*  CREATININE 3.07* 3.08* 3.31* 3.28* 3.32*  CALCIUM 8.5* 8.2* 8.3* 8.5* 8.5*  PHOS  --   --   --  3.2  --    Liver Function Tests:  Recent Labs Lab 08/09/15 1657 08/10/15 0822 08/11/15 0928 08/12/15 1121 08/13/15 0647 08/13/15 0648  AST 99* 73* 80* 102*  --  95*  ALT 39 36 37 47  --  48  ALKPHOS 301* 285* 281*  314*  --  293*  BILITOT 2.1* 1.2 1.3* 1.4*  --  1.4*  PROT 5.5* 5.0* 4.6* 4.7*  --  4.7*  ALBUMIN 2.4* 2.2* 2.0* 2.0* 2.0* 2.0*   No results for input(s): LIPASE, AMYLASE in the last 168 hours. No results for input(s): AMMONIA in the last 168 hours. CBC:  Recent Labs Lab 08/09/15 1657 08/10/15 0822 08/12/15 1121  WBC 8.8 6.1 9.5  NEUTROABS 7.1  --  7.5  HGB 10.8* 9.9* 9.2*  HCT 32.2* 29.3* 27.6*  MCV 91.7 89.9 92.3  PLT 151 143* 118*   Cardiac Enzymes:  Recent Labs Lab 08/09/15 2004 08/10/15 0822 08/10/15 1544 08/10/15 1958  CKTOTAL  --  122  --   --   TROPONINI 0.13* 0.14* 0.14* 0.15*   BNP: BNP (last 3 results) No results for input(s): BNP in the last 8760 hours.  ProBNP (last 3 results) No results for input(s): PROBNP in the last 8760 hours.  CBG:  Recent Labs Lab 08/12/15 1657 08/12/15 2023 08/13/15 0027 08/13/15 0427 08/13/15 0753  GLUCAP 136* 112* 156* 81 117*       Signed:  Louellen Molder MD.  [facility] 08/13/2015, 10:23 AM

## 2015-08-13 NOTE — Clinical Social Work Placement (Signed)
   CLINICAL SOCIAL WORK PLACEMENT  NOTE  Date:  08/13/2015  Patient Details  Name: Kyle Parrish MRN: [place]:[id number] Date of Birth: [birth year]  Clinical Social Work is seeking post-discharge placement for this patient at the [place] level of care (*CSW will initial, date and re-position this form in  chart as items are completed):  Yes   Patient/family provided with [place] Work Department's list of facilities offering this level of care within the geographic area requested by the patient (or if unable, by the patient's family).  Yes   Patient/family informed of their freedom to choose among providers that offer the needed level of care, that participate in Medicare, Medicaid or managed care program needed by the patient, have an available bed and are willing to accept the patient.  Yes   Patient/family informed of [facility]'s ownership interest in [facility] and [facility], as well as of the fact that they are under no obligation to receive care at these facilities.  PASRR submitted to EDS on 08/11/15     PASRR number received on 08/11/15     Existing PASRR number confirmed on       FL2 transmitted to all facilities in geographic area requested by pt/family on 08/11/15     FL2 transmitted to all facilities within larger geographic area on       Patient informed that his/her managed care company has contracts with or will negotiate with certain facilities, including the following:        Yes   Patient/family informed of bed offers received.  Patient chooses bed at [facility]     Physician recommends and patient chooses bed at      Patient to be transferred to [facility] on 08/13/15.  Patient to be transferred to facility by [facility]     Patient family notified on 08/13/15 of transfer.  Name of family member notified:  Mardene Celeste     PHYSICIAN Please sign DNR     Additional Comment:     _______________________________________________ Benard Halsted, [place] 08/13/2015, 1:18 PM

## 2015-08-13 NOTE — Progress Notes (Signed)
Patient will DC to: [facility] Anticipated DC date: 08/13/15 Family notified: Daughter Transport by: [facility] 1:30pm   Per MD patient ready for DC to [place]. RN, patient, patient's family, and facility notified of DC. RN given number for report. DC packet on chart. Ambulance transport requested for patient.   CSW signing off.  Cedric Fishman, [place] Social Worker [phone]

## 2015-08-15 ENCOUNTER — Encounter: Payer: Self-pay | Admitting: Internal Medicine

## 2015-08-15 ENCOUNTER — Non-Acute Institutional Stay (SKILLED_NURSING_FACILITY): Payer: Medicare Other | Admitting: Internal Medicine

## 2015-08-15 DIAGNOSIS — R5381 Other malaise: Secondary | ICD-10-CM | POA: Diagnosis not present

## 2015-08-15 DIAGNOSIS — D638 Anemia in other chronic diseases classified elsewhere: Secondary | ICD-10-CM | POA: Diagnosis not present

## 2015-08-15 DIAGNOSIS — N400 Enlarged prostate without lower urinary tract symptoms: Secondary | ICD-10-CM

## 2015-08-15 DIAGNOSIS — I959 Hypotension, unspecified: Secondary | ICD-10-CM

## 2015-08-15 DIAGNOSIS — E039 Hypothyroidism, unspecified: Secondary | ICD-10-CM

## 2015-08-15 DIAGNOSIS — N183 Chronic kidney disease, stage 3 unspecified: Secondary | ICD-10-CM

## 2015-08-15 DIAGNOSIS — E118 Type 2 diabetes mellitus with unspecified complications: Secondary | ICD-10-CM | POA: Diagnosis not present

## 2015-08-15 DIAGNOSIS — R1310 Dysphagia, unspecified: Secondary | ICD-10-CM | POA: Diagnosis not present

## 2015-08-15 DIAGNOSIS — K7469 Other cirrhosis of liver: Secondary | ICD-10-CM | POA: Diagnosis not present

## 2015-08-15 DIAGNOSIS — I4891 Unspecified atrial fibrillation: Secondary | ICD-10-CM | POA: Diagnosis not present

## 2015-08-15 DIAGNOSIS — I2510 Atherosclerotic heart disease of native coronary artery without angina pectoris: Secondary | ICD-10-CM

## 2015-08-15 DIAGNOSIS — E46 Unspecified protein-calorie malnutrition: Secondary | ICD-10-CM | POA: Diagnosis not present

## 2015-08-15 DIAGNOSIS — Z794 Long term (current) use of insulin: Secondary | ICD-10-CM

## 2015-08-15 NOTE — Progress Notes (Signed)
LOCATION: [facility]  PCP: Wyatt Haste, MD   Code Status: DNR  Goals of care: Advanced Directive information Advanced Directives 08/15/2015  Does patient have an advance directive? Yes  Type of Advance Directive Out of facility DNR (pink MOST or yellow form)  Copy of advanced directive(s) in chart? Yes  Would patient like information on creating an advanced directive? -       Extended Emergency Contact Information Primary Emergency Contact: Humburg,Patricia Address: [address], [place] Phone: [phone] Work Phone: [phone] Relation: Other   No Known Allergies  Chief Complaint  Patient presents with  . New Admit To SNF    New Admission     HPI:  Patient is a 82 y.o. male seen today for short term rehabilitation post hospital admission from 08/09/15-08/13/15 with hypoglycemia and acute on chronic renal failure. Hypoglycemia was thought to be iatrogenic. His tujeo, actos and metformin has been discontinued. He was given iv fluids and placed on SSI. He has PMH of DM, CKD, Afib, HTN, CAD among others. As per nursing staff, he has been having drop in blood pressure with exertion. He is seen in his room today. History and ROS are limited.   Review of Systems: Limited HPI Constitutional: Negative for fever and diaphoresis.  HENT: Negative for headache, congestion, nasal discharge.  Eyes: Negative for eye pain. Respiratory: Negative for shortness of breath and wheezing. Positive for cough.   Cardiovascular: Negative for chest pain.  Gastrointestinal: Negative for heartburn, nausea, vomiting, abdominal pain. Last bowel movement was today.  Genitourinary: Negative for dysuria and flank pain.  Musculoskeletal: Negative for back pain, fall in the facility.  Skin: Negative for itching, rash.  Neurological: Negative for dizziness. Psychiatric/Behavioral: Negative for depression.    Past Medical History  Diagnosis Date  . NIDDM  (non-insulin dependent diabetes mellitus)   . Nephropathy   . History of ST elevation myocardial infarction (STEMI) of anterior wall June 2003    Anterior MI in the setting of GI bleed; initially treated medically; 10 weeks later underwent cardiac catheterization PCI the LAD.  Marland Kitchen CAD S/P percutaneous coronary angioplasty July 2003    PCI of the LAD - HepaCoat BMS 2.5 mm x 13 mm (3.7 mm).  . History of Ischemic cardiomyopathy Summer of 2003 - 2010    2003: Initial EF Post PCI was 40%; by Myoview in 2005 EF is 51% but no ischemia.;  No recurrence of heart failure or following PCI in 2003;; Echo 03/2008: Normal LV size & function, Gr 1 DD with elevated LAP, Mild Ao sclerosis  . Obesity   . Actinic keratoses   . Colon cancer ([place])   . Dyslipidemia   . Gout   . Hypothyroid   . Anemia   . 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 Disc herniation   . Hard of hearing      very hard of hearing   Past Surgical History  Procedure Laterality Date  . Colectomy  2003  . Colonoscopy  2008  . Tte  2010  . Nm myoview ltd  2005  . Cardiac catheterization  2003   Social History:   reports that he has been smoking Cigars.  He has never used smokeless tobacco. He reports that he does not drink alcohol or use illicit drugs.  Family History  Problem Relation Age of Onset  . Family history unknown: Yes  Medications:   Medication List       This list is accurate as of: 08/15/15 10:32 AM.  Always use your most recent med list.               aspirin 81 MG tablet  Take 81 mg by mouth daily.     carvedilol 6.25 MG tablet  Commonly known as:  COREG  TAKE ONE TABLET BY MOUTH ONCE DAILY     Dorzolamide HCl-Timolol Mal PF 22.3-6.8 MG/ML Soln  Apply 1 drop to eye 2 (two) times daily.     folic acid 1 MG tablet  Commonly known as:  FOLVITE  Take 1 tablet (1 mg total) by mouth daily.     insulin aspart 100 UNIT/ML injection  Commonly known as:  novoLOG  Inject 0-9 Units into the  skin 3 (three) times daily with meals.     latanoprost 0.005 % ophthalmic solution  Commonly known as:  XALATAN  Place 1 drop into both eyes at bedtime.     levothyroxine 88 MCG tablet  Commonly known as:  SYNTHROID, LEVOTHROID  TAKE ONE TABLET BY MOUTH ONCE DAILY     MENS MULTIVITAMIN PLUS PO  Take by mouth.     sodium bicarbonate 650 MG tablet  Take 1 tablet (650 mg total) by mouth daily.     tamsulosin 0.4 MG Caps capsule  Commonly known as:  FLOMAX  TAKE ONE CAPSULE BY MOUTH ONCE DAILY.     UNABLE TO FIND  Med Name: Med pass 120 mL 2 times daily between meals        Immunizations: Immunization History  Administered Date(s) Administered  . Influenza Split 12/02/2010  . Influenza, High Dose Seasonal PF 12/25/2012, 01/25/2014, 11/20/2014  . Pneumococcal Conjugate-13 01/25/2014  . Pneumococcal Polysaccharide-23 04/06/2011  . Tdap 04/06/2011     Physical Exam: Filed Vitals:   08/15/15 1026  BP: 105/50  Pulse: 77  Temp: 96.9 F (36.1 C)  TempSrc: Oral  Resp: 18  Height: 5\' 6"  (1.676 m)  Weight: 129 lb (58.514 kg)  SpO2: 96%   Body mass index is 20.83 kg/(m^2).  General- elderly male, frail and thin built, in no acute distress Head- normocephalic, atraumatic Nose- no maxillary or frontal sinus tenderness, no nasal discharge Throat- moist mucus membrane Eyes- PERRLA, EOMI, no pallor, no icterus Neck- no cervical lymphadenopathy Cardiovascular- normal s1,s2, no murmur Respiratory- bilateral clear to auscultation, no wheeze, no rhonchi, no crackles, no use of accessory muscles Abdomen- bowel sounds present, soft, non tender Musculoskeletal- able to move all 4 extremities, generalized weakness trace leg and arm edema Neurological- alert and oriented to person, place and time but slow in answering questions Skin- warm and dry, bruising + Psychiatry- normal mood and affect    Labs reviewed: Basic Metabolic Panel:  Recent Labs  08/12/15 1121  08/13/15 0647 08/13/15 0648  NA 133* 136 135  K 4.3 4.4 4.4  CL 106 108 107  CO2 18* 18* 18*  GLUCOSE 97 107* 110*  BUN 69* 71* 72*  CREATININE 3.31* 3.28* 3.32*  CALCIUM 8.3* 8.5* 8.5*  PHOS  --  3.2  --    Liver Function Tests:  Recent Labs  08/11/15 0928 08/12/15 1121 08/13/15 0647 08/13/15 0648  AST 80* 102*  --  95*  ALT 37 47  --  48  ALKPHOS 281* 314*  --  293*  BILITOT 1.3* 1.4*  --  1.4*  PROT 4.6* 4.7*  --  4.7*  ALBUMIN  2.0* 2.0* 2.0* 2.0*   No results for input(s): LIPASE, AMYLASE in the last 8760 hours. No results for input(s): AMMONIA in the last 8760 hours. CBC:  Recent Labs  07/18/15 1020 08/09/15 1657 08/10/15 0822 08/12/15 1121  WBC 11.7* 8.8 6.1 9.5  NEUTROABS 9243* 7.1  --  7.5  HGB 10.6* 10.8* 9.9* 9.2*  HCT 32.7* 32.2* 29.3* 27.6*  MCV 94.8 91.7 89.9 92.3  PLT 134* 151 143* 118*   Cardiac Enzymes:  Recent Labs  08/10/15 0822 08/10/15 1544 08/10/15 1958  CKTOTAL 122  --   --   TROPONINI 0.14* 0.14* 0.15*   BNP: Invalid input(s): POCBNP CBG:  Recent Labs  08/13/15 0427 08/13/15 0753 08/13/15 1137  GLUCAP 81 117* 154*    Radiological Exams: Dg Chest 2 View  08/10/2015  CLINICAL DATA:  Followup of pneumonia. EXAM: CHEST  2 VIEW COMPARISON:  08/09/2015 FINDINGS: Both lateral views are degraded posteriorly. Midline trachea. Mild cardiomegaly with a tortuous, calcified thoracic aorta. Although no pleural fluid is identified on the frontal radiograph, there is posterior increased opacity on the lateral view. This is not well localized on the frontal radiograph. No well-defined airspace opacity identified. IMPRESSION: Cardiomegaly without congestive failure. Increase in posterior opacity on the lateral view. Not well localized on the frontal. Likely a combination of loculated pleural fluid and adjacent atelectasis or infection. Electronically Signed   By: Abigail Miyamoto M.D.   On: 08/10/2015 12:46   Dg Chest 2 View  08/09/2015   CLINICAL DATA:  Hypoglycemia.  Fatigue.  Fall. EXAM: CHEST  2 VIEW COMPARISON:  03/27/2008 chest radiograph. FINDINGS: Stable cardiomediastinal silhouette with normal heart size. No pneumothorax. No pleural effusion. There is patchy opacity overlying the lower thoracic spine on the lateral view, probably correlating with a right lower lobe on the frontal view. No pulmonary edema. IMPRESSION: Patchy right lower lobe opacity, nonspecific, cannot exclude a right lower lobe pneumonia. Recommend follow-up PA and lateral post treatment chest radiographs in 4-6 weeks. Electronically Signed   By: Ilona Sorrel M.D.   On: 08/09/2015 18:45   Ct Head Wo Contrast  08/09/2015  CLINICAL DATA:  Unresponsive EXAM: CT HEAD WITHOUT CONTRAST TECHNIQUE: Contiguous axial images were obtained from the base of the skull through the vertex without intravenous contrast. COMPARISON:  None. FINDINGS: Bony calvarium is intact. No gross soft tissue abnormality is seen. Paranasal sinuses are within normal limits. Diffuse atrophic changes are noted. No findings to suggest acute hemorrhage, acute infarction or space-occupying mass lesion are noted. IMPRESSION: Atrophic changes without acute abnormality. Electronically Signed   By: Inez Catalina M.D.   On: 08/09/2015 19:05   US Abdomen Complete  08/12/2015  CLINICAL DATA:  Acute renal insufficiency as well as elevated LFTs. Previous colectomy for colon cancer. EXAM: ABDOMEN ULTRASOUND COMPLETE COMPARISON:  08/15/2001 FINDINGS: Gallbladder: Moderate cholelithiasis as the largest stone measures 1 cm. No gallbladder wall thickening. No pericholecystic fluid and negative sonographic Murphy sign. Common bile duct: Diameter: 3.9 mm. Liver: There is diffuse heterogeneity without definite focal mass. There is nodularity to the liver contour suggesting cirrhosis. There is mild ascites. IVC: No abnormality visualized. Pancreas: Visualized portion unremarkable. Spleen: Size and appearance within normal  limits. Right Kidney: Length: 9.7 cm. Generalized cortical thinning. Echogenicity within normal limits. No mass or hydronephrosis visualized. Left Kidney: Length: 0.4 cm. Echogenicity within normal limits. No mass or hydronephrosis visualized. Abdominal aorta: Mild-to-moderate atherosclerotic disease without evidence of aneurysm. Other findings: Small bilateral pleural effusions. IMPRESSION: Moderate cholelithiasis  without additional sonographic evidence to suggest cholecystitis. Findings suggesting cirrhosis with mild ascites. Small bilateral pleural effusions. Kidneys normal size without hydronephrosis. Mild right renal cortical thinning. Electronically Signed   By: Marin Olp M.D.   On: 08/12/2015 14:31    Assessment/Plan  Physical deconditioning Will have him work with physical therapy and occupational therapy team to help with gait training and muscle strengthening exercises.fall precautions. Skin care. Encourage to be out of bed.   Hypotension With change of position. Currently on coreg. Possible iatrogenic etiology along with deconditioning. Discontinue coreg for now. Check orthostatic vital signs daily and VS q shift for now.  DM with ckd Lab Results  Component Value Date   HGBA1C 8.5* 08/10/2015  monitor cbg. continue SSI novolog  Protein calorie malnutrition Monitor po intake, to provide assistance with feed. Continue feeding supplement, folic acid and MVI. Get dietary consult  Dysphagia Get SLP consult, aspiration precautions  Liver cirrhosis Monitor lft  Anemia of chronic disease Monitor cbc  CAD Chest pain free. Continue aspirin 81 mg daily. Hold coreg for now with low bp  afib Rate controlled. Has pacemaker. Off digoxin with impaired renal function. Holding coreg with low bp for now. Monitor HR.   Hypothyroidism Continue levothyroxine 88 mcg daily  Bph Continue flomax  ckd stage 3 Monitor bmp for now  Goals of care: short term  rehabilitation   Labs/tests ordered: cbc, cmp, tsh  Family/ staff Communication: reviewed care plan with patient and nursing supervisor    Blanchie Serve, MD Internal Medicine [place], [place] [address] Cell Phone (Monday-Friday 8 am - 5 pm): [phone] On Call: [phone] and follow prompts after 5 pm and on weekends Office Phone: [phone] Office Fax: [phone]

## 2015-08-18 ENCOUNTER — Encounter (HOSPITAL_COMMUNITY): Payer: Self-pay | Admitting: Emergency Medicine

## 2015-08-18 ENCOUNTER — Inpatient Hospital Stay (HOSPITAL_COMMUNITY)
Admission: EM | Admit: 2015-08-18 | Discharge: 2015-08-21 | DRG: 871 | Disposition: A | Discharge status: Hospice | Payer: Medicare Other | Attending: Internal Medicine | Admitting: Internal Medicine

## 2015-08-18 ENCOUNTER — Emergency Department (HOSPITAL_COMMUNITY): Payer: Medicare Other

## 2015-08-18 DIAGNOSIS — I959 Hypotension, unspecified: Secondary | ICD-10-CM | POA: Diagnosis present

## 2015-08-18 DIAGNOSIS — N179 Acute kidney failure, unspecified: Secondary | ICD-10-CM | POA: Diagnosis present

## 2015-08-18 DIAGNOSIS — D696 Thrombocytopenia, unspecified: Secondary | ICD-10-CM | POA: Diagnosis present

## 2015-08-18 DIAGNOSIS — R6521 Severe sepsis with septic shock: Secondary | ICD-10-CM | POA: Diagnosis present

## 2015-08-18 DIAGNOSIS — N400 Enlarged prostate without lower urinary tract symptoms: Secondary | ICD-10-CM | POA: Diagnosis present

## 2015-08-18 DIAGNOSIS — Z66 Do not resuscitate: Secondary | ICD-10-CM | POA: Diagnosis present

## 2015-08-18 DIAGNOSIS — Z7401 Bed confinement status: Secondary | ICD-10-CM

## 2015-08-18 DIAGNOSIS — Y95 Nosocomial condition: Secondary | ICD-10-CM | POA: Diagnosis present

## 2015-08-18 DIAGNOSIS — Z515 Encounter for palliative care: Secondary | ICD-10-CM | POA: Diagnosis present

## 2015-08-18 DIAGNOSIS — D649 Anemia, unspecified: Secondary | ICD-10-CM | POA: Diagnosis present

## 2015-08-18 DIAGNOSIS — Z85828 Personal history of other malignant neoplasm of skin: Secondary | ICD-10-CM

## 2015-08-18 DIAGNOSIS — Z7982 Long term (current) use of aspirin: Secondary | ICD-10-CM

## 2015-08-18 DIAGNOSIS — D6489 Other specified anemias: Secondary | ICD-10-CM | POA: Diagnosis present

## 2015-08-18 DIAGNOSIS — N183 Chronic kidney disease, stage 3 (moderate): Secondary | ICD-10-CM | POA: Diagnosis present

## 2015-08-18 DIAGNOSIS — I248 Other forms of acute ischemic heart disease: Secondary | ICD-10-CM | POA: Diagnosis present

## 2015-08-18 DIAGNOSIS — I129 Hypertensive chronic kidney disease with stage 1 through stage 4 chronic kidney disease, or unspecified chronic kidney disease: Secondary | ICD-10-CM | POA: Diagnosis present

## 2015-08-18 DIAGNOSIS — Z7189 Other specified counseling: Secondary | ICD-10-CM | POA: Diagnosis not present

## 2015-08-18 DIAGNOSIS — Z79899 Other long term (current) drug therapy: Secondary | ICD-10-CM

## 2015-08-18 DIAGNOSIS — E038 Other specified hypothyroidism: Secondary | ICD-10-CM

## 2015-08-18 DIAGNOSIS — Z9049 Acquired absence of other specified parts of digestive tract: Secondary | ICD-10-CM

## 2015-08-18 DIAGNOSIS — L8990 Pressure ulcer of unspecified site, unspecified stage: Secondary | ICD-10-CM | POA: Diagnosis present

## 2015-08-18 DIAGNOSIS — E1122 Type 2 diabetes mellitus with diabetic chronic kidney disease: Secondary | ICD-10-CM | POA: Diagnosis present

## 2015-08-18 DIAGNOSIS — R748 Abnormal levels of other serum enzymes: Secondary | ICD-10-CM | POA: Diagnosis present

## 2015-08-18 DIAGNOSIS — G92 Toxic encephalopathy: Secondary | ICD-10-CM | POA: Diagnosis present

## 2015-08-18 DIAGNOSIS — M109 Gout, unspecified: Secondary | ICD-10-CM | POA: Diagnosis present

## 2015-08-18 DIAGNOSIS — K8020 Calculus of gallbladder without cholecystitis without obstruction: Secondary | ICD-10-CM | POA: Diagnosis present

## 2015-08-18 DIAGNOSIS — R778 Other specified abnormalities of plasma proteins: Secondary | ICD-10-CM | POA: Diagnosis present

## 2015-08-18 DIAGNOSIS — J690 Pneumonitis due to inhalation of food and vomit: Secondary | ICD-10-CM | POA: Diagnosis present

## 2015-08-18 DIAGNOSIS — R7989 Other specified abnormal findings of blood chemistry: Secondary | ICD-10-CM

## 2015-08-18 DIAGNOSIS — R4182 Altered mental status, unspecified: Secondary | ICD-10-CM | POA: Diagnosis not present

## 2015-08-18 DIAGNOSIS — E039 Hypothyroidism, unspecified: Secondary | ICD-10-CM | POA: Diagnosis present

## 2015-08-18 DIAGNOSIS — H9190 Unspecified hearing loss, unspecified ear: Secondary | ICD-10-CM | POA: Diagnosis present

## 2015-08-18 DIAGNOSIS — E118 Type 2 diabetes mellitus with unspecified complications: Secondary | ICD-10-CM

## 2015-08-18 DIAGNOSIS — I252 Old myocardial infarction: Secondary | ICD-10-CM

## 2015-08-18 DIAGNOSIS — Z794 Long term (current) use of insulin: Secondary | ICD-10-CM

## 2015-08-18 DIAGNOSIS — I2510 Atherosclerotic heart disease of native coronary artery without angina pectoris: Secondary | ICD-10-CM | POA: Diagnosis present

## 2015-08-18 DIAGNOSIS — R160 Hepatomegaly, not elsewhere classified: Secondary | ICD-10-CM | POA: Diagnosis not present

## 2015-08-18 DIAGNOSIS — F17210 Nicotine dependence, cigarettes, uncomplicated: Secondary | ICD-10-CM | POA: Diagnosis present

## 2015-08-18 DIAGNOSIS — I9589 Other hypotension: Secondary | ICD-10-CM

## 2015-08-18 DIAGNOSIS — K7460 Unspecified cirrhosis of liver: Secondary | ICD-10-CM | POA: Diagnosis present

## 2015-08-18 DIAGNOSIS — J90 Pleural effusion, not elsewhere classified: Secondary | ICD-10-CM | POA: Diagnosis present

## 2015-08-18 DIAGNOSIS — C787 Secondary malignant neoplasm of liver and intrahepatic bile duct: Secondary | ICD-10-CM | POA: Diagnosis present

## 2015-08-18 DIAGNOSIS — A419 Sepsis, unspecified organism: Principal | ICD-10-CM | POA: Diagnosis present

## 2015-08-18 DIAGNOSIS — E785 Hyperlipidemia, unspecified: Secondary | ICD-10-CM | POA: Diagnosis present

## 2015-08-18 DIAGNOSIS — F419 Anxiety disorder, unspecified: Secondary | ICD-10-CM | POA: Diagnosis present

## 2015-08-18 DIAGNOSIS — C189 Malignant neoplasm of colon, unspecified: Secondary | ICD-10-CM | POA: Diagnosis present

## 2015-08-18 DIAGNOSIS — I480 Paroxysmal atrial fibrillation: Secondary | ICD-10-CM | POA: Diagnosis present

## 2015-08-18 DIAGNOSIS — Z955 Presence of coronary angioplasty implant and graft: Secondary | ICD-10-CM

## 2015-08-18 LAB — URINE MICROSCOPIC-ADD ON

## 2015-08-18 LAB — CBC WITH DIFFERENTIAL/PLATELET
BASOS ABS: 0 10*3/uL (ref 0.0–0.1)
Basophils Relative: 0 %
EOS ABS: 0.1 10*3/uL (ref 0.0–0.7)
EOS PCT: 1 %
HCT: 29.6 % — ABNORMAL LOW (ref 39.0–52.0)
Hemoglobin: 10.3 g/dL — ABNORMAL LOW (ref 13.0–17.0)
LYMPHS ABS: 1.2 10*3/uL (ref 0.7–4.0)
Lymphocytes Relative: 12 %
MCH: 31.2 pg (ref 26.0–34.0)
MCHC: 34.8 g/dL (ref 30.0–36.0)
MCV: 89.7 fL (ref 78.0–100.0)
MONO ABS: 0.7 10*3/uL (ref 0.1–1.0)
Monocytes Relative: 7 %
NEUTROS PCT: 80 %
Neutro Abs: 7.8 10*3/uL — ABNORMAL HIGH (ref 1.7–7.7)
PLATELETS: 138 10*3/uL — AB (ref 150–400)
RBC: 3.3 MIL/uL — AB (ref 4.22–5.81)
RDW: 19.6 % — AB (ref 11.5–15.5)
WBC: 9.8 10*3/uL (ref 4.0–10.5)

## 2015-08-18 LAB — URINALYSIS, ROUTINE W REFLEX MICROSCOPIC
Glucose, UA: NEGATIVE mg/dL
Ketones, ur: NEGATIVE mg/dL
Nitrite: POSITIVE — AB
PH: 5 (ref 5.0–8.0)
Protein, ur: 30 mg/dL — AB
SPECIFIC GRAVITY, URINE: 1.015 (ref 1.005–1.030)

## 2015-08-18 LAB — COMPREHENSIVE METABOLIC PANEL
ALT: 55 U/L (ref 17–63)
AST: 121 U/L — AB (ref 15–41)
Albumin: 2.1 g/dL — ABNORMAL LOW (ref 3.5–5.0)
Alkaline Phosphatase: 345 U/L — ABNORMAL HIGH (ref 38–126)
Anion gap: 14 (ref 5–15)
BUN: 105 mg/dL — AB (ref 6–20)
CHLORIDE: 106 mmol/L (ref 101–111)
CO2: 15 mmol/L — AB (ref 22–32)
CREATININE: 4.94 mg/dL — AB (ref 0.61–1.24)
Calcium: 8.4 mg/dL — ABNORMAL LOW (ref 8.9–10.3)
GFR calc Af Amer: 10 mL/min — ABNORMAL LOW (ref >60)
GFR, EST NON AFRICAN AMERICAN: 9 mL/min — AB (ref >60)
Glucose, Bld: 96 mg/dL (ref 65–99)
POTASSIUM: 4.3 mmol/L (ref 3.5–5.1)
SODIUM: 135 mmol/L (ref 135–145)
Total Bilirubin: 1.3 mg/dL — ABNORMAL HIGH (ref 0.3–1.2)
Total Protein: 5 g/dL — ABNORMAL LOW (ref 6.5–8.1)

## 2015-08-18 LAB — I-STAT CHEM 8, ED
BUN: 100 mg/dL — AB (ref 6–20)
CALCIUM ION: 1.19 mmol/L (ref 1.13–1.30)
CHLORIDE: 105 mmol/L (ref 101–111)
Creatinine, Ser: 5 mg/dL — ABNORMAL HIGH (ref 0.61–1.24)
GLUCOSE: 87 mg/dL (ref 65–99)
HEMATOCRIT: 32 % — AB (ref 39.0–52.0)
Hemoglobin: 10.9 g/dL — ABNORMAL LOW (ref 13.0–17.0)
Potassium: 4.3 mmol/L (ref 3.5–5.1)
SODIUM: 135 mmol/L (ref 135–145)
TCO2: 15 mmol/L (ref 0–100)

## 2015-08-18 LAB — CBG MONITORING, ED: GLUCOSE-CAPILLARY: 92 mg/dL (ref 65–99)

## 2015-08-18 LAB — I-STAT TROPONIN, ED: TROPONIN I, POC: 0.11 ng/mL — AB (ref 0.00–0.08)

## 2015-08-18 LAB — GLUCOSE, CAPILLARY: Glucose-Capillary: 93 mg/dL (ref 65–99)

## 2015-08-18 LAB — I-STAT CG4 LACTIC ACID, ED: LACTIC ACID, VENOUS: 4.06 mmol/L — AB (ref 0.5–2.0)

## 2015-08-18 LAB — PROCALCITONIN: PROCALCITONIN: 72.57 ng/mL

## 2015-08-18 LAB — TROPONIN I: TROPONIN I: 0.1 ng/mL — AB (ref <0.031)

## 2015-08-18 LAB — MRSA PCR SCREENING: MRSA by PCR: NEGATIVE

## 2015-08-18 MED ORDER — SODIUM CHLORIDE 0.9 % IV SOLN
INTRAVENOUS | Status: AC
  Administered 2015-08-18: 19:00:00 via INTRAVENOUS

## 2015-08-18 MED ORDER — BENZOCAINE 20 % MT AERO
INHALATION_SPRAY | Freq: Once | OROMUCOSAL | Status: DC
  Filled 2015-08-18 (×2): qty 57

## 2015-08-18 MED ORDER — ASPIRIN 81 MG PO CHEW: 81.0000 mg | CHEWABLE_TABLET | Freq: Every day | ORAL | Status: DC

## 2015-08-18 MED ORDER — LATANOPROST 0.005 % OP SOLN
1.0000 [drp] | Freq: Every day | OPHTHALMIC | Status: DC
  Administered 2015-08-18 – 2015-08-20 (×3): 1 [drp] via OPHTHALMIC
  Filled 2015-08-18: qty 2.5

## 2015-08-18 MED ORDER — PIPERACILLIN-TAZOBACTAM IN DEX 2-0.25 GM/50ML IV SOLN
2.2500 g | Freq: Three times a day (TID) | INTRAVENOUS | Status: DC
  Administered 2015-08-18 – 2015-08-21 (×8): 2.25 g via INTRAVENOUS
  Filled 2015-08-18 (×9): qty 50

## 2015-08-18 MED ORDER — SODIUM CHLORIDE 0.9 % IV BOLUS (SEPSIS)
2000.0000 mL | Freq: Once | INTRAVENOUS | Status: AC
  Administered 2015-08-18: 2000 mL via INTRAVENOUS

## 2015-08-18 MED ORDER — TAMSULOSIN HCL 0.4 MG PO CAPS: 0.4000 mg | ORAL_CAPSULE | Freq: Every day | ORAL | Status: DC

## 2015-08-18 MED ORDER — INSULIN ASPART 100 UNIT/ML ~~LOC~~ SOLN
0.0000 [IU] | SUBCUTANEOUS | Status: DC
  Administered 2015-08-19 (×2): 1 [IU] via SUBCUTANEOUS

## 2015-08-18 MED ORDER — FOLIC ACID 1 MG PO TABS: 1.0000 mg | ORAL_TABLET | Freq: Every day | ORAL | Status: DC

## 2015-08-18 MED ORDER — VANCOMYCIN HCL 10 G IV SOLR
1250.0000 mg | Freq: Once | INTRAVENOUS | Status: AC
  Administered 2015-08-18: 1250 mg via INTRAVENOUS
  Filled 2015-08-18: qty 1250

## 2015-08-18 MED ORDER — HEPARIN SODIUM (PORCINE) 5000 UNIT/ML IJ SOLN
5000.0000 [IU] | Freq: Three times a day (TID) | INTRAMUSCULAR | Status: DC
  Administered 2015-08-18 – 2015-08-21 (×7): 5000 [IU] via SUBCUTANEOUS
  Filled 2015-08-18 (×7): qty 1

## 2015-08-18 MED ORDER — ADULT MULTIVITAMIN W/MINERALS CH: 1.0000 | ORAL_TABLET | Freq: Every day | ORAL | Status: DC

## 2015-08-18 MED ORDER — LEVOTHYROXINE SODIUM 88 MCG PO TABS: 88.0000 ug | ORAL_TABLET | Freq: Every day | ORAL | Status: DC

## 2015-08-18 MED ORDER — DORZOLAMIDE HCL-TIMOLOL MAL 2-0.5 % OP SOLN
1.0000 [drp] | Freq: Two times a day (BID) | OPHTHALMIC | Status: DC
  Administered 2015-08-18 – 2015-08-21 (×6): 1 [drp] via OPHTHALMIC
  Filled 2015-08-18: qty 10

## 2015-08-18 MED ORDER — PIPERACILLIN-TAZOBACTAM 3.375 G IVPB 30 MIN
3.3750 g | Freq: Once | INTRAVENOUS | Status: AC
  Administered 2015-08-18: 3.375 g via INTRAVENOUS
  Filled 2015-08-18: qty 50

## 2015-08-18 NOTE — ED Notes (Signed)
Floyd aware oral/axillary temperature do not register; bear hugger applied.

## 2015-08-18 NOTE — ED Provider Notes (Addendum)
CSN: [id number]     Arrival date & time 08/18/15  1512 History   First MD Initiated Contact with Patient 08/18/15 1525     Chief Complaint  Patient presents with  . Weakness  . Altered Mental Status     (Consider location/radiation/quality/duration/timing/severity/associated sxs/prior Treatment) Patient is a 82 y.o. male presenting with altered mental status. The history is provided by the patient, the nursing home and the EMS personnel.  Altered Mental Status Presenting symptoms: confusion and disorientation   Severity:  Severe Most recent episode:  Yesterday Episode history:  Continuous Timing:  Constant Progression:  Worsening Chronicity:  New Context: nursing home resident   Associated symptoms: no abdominal pain, no fever, no headaches, no palpitations, no rash and no vomiting    82 yo M With a chief complaint of altered mental status. Patient was just admitted to the hospital about a week ago for hypoglycemia. Patient was discharged to a rehabilitation facility. He is made a DO NOT RESUSCITATE by his family in the last day. Per nursing home he had gotten significantly worse over the past couple days. Is no longer speaking.   Level V caveat in ability to speak.  Past Medical History  Diagnosis Date  . NIDDM (non-insulin dependent diabetes mellitus)   . Nephropathy   . History of ST elevation myocardial infarction (STEMI) of anterior wall June 2003    Anterior MI in the setting of GI bleed; initially treated medically; 10 weeks later underwent cardiac catheterization PCI the LAD.  Marland Kitchen CAD S/P percutaneous coronary angioplasty July 2003    PCI of the LAD - HepaCoat BMS 2.5 mm x 13 mm (3.7 mm).  . History of Ischemic cardiomyopathy Summer of 2003 - 2010    2003: Initial EF Post PCI was 40%; by Myoview in 2005 EF is 51% but no ischemia.;  No recurrence of heart failure or following PCI in 2003;; Echo 03/2008: Normal LV size & function, Gr 1 DD with elevated LAP, Mild Ao sclerosis   . Obesity   . Actinic keratoses   . Colon cancer ([place])   . Dyslipidemia   . Gout   . Hypothyroid   . Anemia   . 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 Disc herniation   . Hard of hearing      very hard of hearing   Past Surgical History  Procedure Laterality Date  . Colectomy  2003  . Colonoscopy  2008  . Tte  2010  . Nm myoview ltd  2005  . Cardiac catheterization  2003   Family History  Problem Relation Age of Onset  . Family history unknown: Yes   Social History  Substance Use Topics  . Smoking status: Current Every Day Smoker -- 2.00 packs/day    Types: Cigars  . Smokeless tobacco: Never Used     Comment: 2-3 cigars a day  . Alcohol Use: No    Review of Systems  Constitutional: Negative for fever and chills.  HENT: Negative for congestion and facial swelling.   Eyes: Negative for discharge and visual disturbance.  Respiratory: Negative for shortness of breath.   Cardiovascular: Negative for chest pain and palpitations.  Gastrointestinal: Negative for vomiting, abdominal pain and diarrhea.  Musculoskeletal: Negative for myalgias and arthralgias.  Skin: Negative for color change and rash.  Neurological: Negative for tremors, syncope and headaches.  Psychiatric/Behavioral: Positive for confusion. Negative for dysphoric mood.      Allergies  Review of patient's  allergies indicates no known allergies.  Home Medications   Prior to Admission medications   Medication Sig Start Date End Date Taking? Authorizing Provider  aspirin 81 MG tablet Take 81 mg by mouth daily.     Yes Historical Provider, MD  carvedilol (COREG) 6.25 MG tablet TAKE ONE TABLET BY MOUTH ONCE DAILY Patient taking differently: TAKE 6.25 MG BY MOUTH ONCE DAILY 09/10/13  Yes Denita Lung, MD  Dorzolamide HCl-Timolol Mal PF 22.3-6.8 MG/ML SOLN Apply 1 drop to eye 2 (two) times daily.   Yes Historical Provider, MD  folic acid (FOLVITE) 1 MG tablet Take 1 tablet (1 mg total) by mouth  daily. 02/04/15  Yes Denita Lung, MD  insulin aspart (NOVOLOG) 100 UNIT/ML injection Inject 0-9 Units into the skin 3 (three) times daily with meals. 08/13/15  Yes Nishant Dhungel, MD  latanoprost (XALATAN) 0.005 % ophthalmic solution Place 1 drop into both eyes at bedtime.   Yes Historical Provider, MD  levothyroxine (SYNTHROID, LEVOTHROID) 88 MCG tablet TAKE ONE TABLET BY MOUTH ONCE DAILY Patient taking differently: TAKE 88 MCG BY MOUTH ONCE DAILY 08/06/15  Yes Denita Lung, MD  Multiple Vitamins-Minerals (MENS MULTIVITAMIN PLUS PO) Take 1 tablet by mouth daily.    Yes Historical Provider, MD  sodium bicarbonate 650 MG tablet Take 1 tablet (650 mg total) by mouth daily. 08/12/15  Yes Nita Sells, MD  tamsulosin (FLOMAX) 0.4 MG CAPS capsule TAKE ONE CAPSULE BY MOUTH ONCE DAILY. Patient taking differently: TAKE 0.4 MG BY MOUTH ONCE DAILY. 07/11/15  Yes Denita Lung, MD  UNABLE TO FIND Med Name: Med pass 120 mL 2 times daily between meals   Yes Historical Provider, MD   BP 96/49 mmHg  Pulse 58  Temp(Src) 97.5 F (36.4 C) (Axillary)  Resp 16  Wt 134 lb 9.6 oz (61.054 kg)  SpO2 100% Physical Exam  Constitutional: He is oriented to person, place, and time. He appears toxic. He has a sickly appearance. He appears ill. He appears distressed.  HENT:  Head: Normocephalic and atraumatic.  Eyes: EOM are normal. Pupils are equal, round, and reactive to light.  Neck: Normal range of motion. Neck supple. No JVD present.  Cardiovascular: Normal rate and regular rhythm.  Exam reveals no gallop and no friction rub.   No murmur heard. Pulmonary/Chest: No respiratory distress. He has no wheezes.  Abdominal: He exhibits no distension. There is tenderness in the epigastric area. There is no rebound and no guarding.  Musculoskeletal: Normal range of motion.  Neurological: He is alert and oriented to person, place, and time.  Skin: No rash noted. No pallor.  Psychiatric: He has a normal mood and  affect. His behavior is normal.  Nursing note and vitals reviewed.   ED Course  Procedures (including critical care time) Labs Review Labs Reviewed  CBC WITH DIFFERENTIAL/PLATELET - Abnormal; Notable for the following:    RBC 3.30 (*)    Hemoglobin 10.3 (*)    HCT 29.6 (*)    RDW 19.6 (*)    Platelets 138 (*)    Neutro Abs 7.8 (*)    All other components within normal limits  COMPREHENSIVE METABOLIC PANEL - Abnormal; Notable for the following:    CO2 15 (*)    BUN 105 (*)    Creatinine, Ser 4.94 (*)    Calcium 8.4 (*)    Total Protein 5.0 (*)    Albumin 2.1 (*)    AST 121 (*)    Alkaline Phosphatase  345 (*)    Total Bilirubin 1.3 (*)    GFR calc non Af Amer 9 (*)    GFR calc Af Amer 10 (*)    All other components within normal limits  I-STAT TROPOININ, ED - Abnormal; Notable for the following:    Troponin i, poc 0.11 (*)    All other components within normal limits  I-STAT CHEM 8, ED - Abnormal; Notable for the following:    BUN 100 (*)    Creatinine, Ser 5.00 (*)    Hemoglobin 10.9 (*)    HCT 32.0 (*)    All other components within normal limits  I-STAT CG4 LACTIC ACID, ED - Abnormal; Notable for the following:    Lactic Acid, Venous 4.06 (*)    All other components within normal limits  CULTURE, BLOOD (ROUTINE X 2)  CULTURE, BLOOD (ROUTINE X 2)  URINALYSIS, ROUTINE W REFLEX MICROSCOPIC (NOT AT [facility])  CBG MONITORING, ED    Imaging Review Dg Chest 2 View  08/18/2015  CLINICAL DATA:  Weakness.  Hypotension.  Altered mental status. EXAM: CHEST  2 VIEW COMPARISON:  08/10/2015. FINDINGS: Poor inspiration. Normal sized heart with an interval decrease in size. The moderate sized bilateral pleural effusions, increased. Interval patchy opacity at the right lung base and linear and mild patchy opacity at the left lung base. No acute bony abnormality. IMPRESSION: 1. Interval pneumonia or patchy atelectasis of the right lung base. 2. Left basilar atelectasis and possible  pneumonia. 3. Moderate-sized bilateral pleural effusions, increased. Electronically Signed   By: Claudie Revering M.D.   On: 08/18/2015 16:32   Ct Head Wo Contrast  08/18/2015  CLINICAL DATA:  Altered mental status EXAM: CT HEAD WITHOUT CONTRAST TECHNIQUE: Contiguous axial images were obtained from the base of the skull through the vertex without intravenous contrast. COMPARISON:  None. FINDINGS: The bony calvarium is intact. No gross soft tissue abnormality is noted. Mild atrophic changes are identified stable from the prior exam. No findings to suggest acute hemorrhage, acute infarction or space-occupying mass lesion are noted. IMPRESSION: Chronic atrophic changes without acute abnormality. Electronically Signed   By: Inez Catalina M.D.   On: 08/18/2015 17:32   I have personally reviewed and evaluated these images and lab results as part of my medical decision-making.   EKG Interpretation   Date/Time:  Monday August 18 2015 15:52:29 EDT Ventricular Rate:  67 PR Interval:  176 QRS Duration: 97 QT Interval:  394 QTC Calculation: 416 R Axis:   -10 Text Interpretation:  Sinus or ectopic atrial rhythm Nonspecific T  abnormalities, lateral leads No significant change since last tracing  Confirmed by Estephanie Hubbs MD, Quillian Quince [id number]) on 08/18/2015 4:50:10 PM      Procedure note: Ultrasound Guided Peripheral IV Ultrasound guided peripheral 1.88 inch angiocath IV placement performed by me. Indications: Nursing unable to place IV. Details: The antecubital fossa and upper arm were evaluated with a multifrequency linear probe. Patent brachial veins were noted. 1 attempt was made to cannulate a vein under realtime US guidance with successful cannulation of the vein and catheter placement. There is return of non-pulsatile dark red blood. The patient tolerated the procedure well without complications. Images archived electronically.  CPT codes: [phone] and [phone]  MDM   Final diagnoses:  Aspiration pneumonia of both  lower lobes, unspecified aspiration pneumonia type [facility])    82 yo M with a chief complaint of altered mental status. Patient hypertensive on arrival significant abdominal tenderness. Code sepsis initiated. We'll give 35  mL/kg of IV fluids back and Zosyn. Patient is a DO NOT RESUSCITATE.  X-ray concerning for pneumonia. Lactate 4. Will admit.  CRITICAL CARE Performed by: Cecilio Asper   Total critical care time: 76 minutes  Critical care time was exclusive of separately billable procedures and treating other patients.  Critical care was necessary to treat or prevent imminent or life-threatening deterioration.  Critical care was time spent personally by me on the following activities: development of treatment plan with patient and/or surrogate as well as nursing, discussions with consultants, evaluation of patient's response to treatment, examination of patient, obtaining history from patient or surrogate, ordering and performing treatments and interventions, ordering and review of laboratory studies, ordering and review of radiographic studies, pulse oximetry and re-evaluation of patient's condition.   The patients results and plan were reviewed and discussed.   Any x-rays performed were independently reviewed by myself.   Differential diagnosis were considered with the presenting HPI.  Medications  vancomycin (VANCOCIN) 1,250 mg in sodium chloride 0.9 % 250 mL IVPB (1,250 mg Intravenous New Bag/Given 08/18/15 1739)  Benzocaine (HURRCAINE) 20 % mouth spray (not administered)  sodium chloride 0.9 % bolus 2,000 mL (2,000 mLs Intravenous New Bag/Given 08/18/15 1550)  piperacillin-tazobactam (ZOSYN) IVPB 3.375 g (0 g Intravenous Stopped 08/18/15 1739)    Filed Vitals:   08/18/15 1610 08/18/15 1645 08/18/15 1700 08/18/15 1736  BP:  90/52 99/61 96/49   Pulse: 66 69 66 58  Temp:      TempSrc:      Resp: 17 19  16   Weight:      SpO2: 100% 98% 98% 100%    Final diagnoses:   Aspiration pneumonia of both lower lobes, unspecified aspiration pneumonia type [facility])    Admission/ observation were discussed with the admitting physician, patient and/or family and they are comfortable with the plan.        Deno Etienne, DO 08/18/15 Johnnye Lana

## 2015-08-18 NOTE — ED Notes (Signed)
Daughter at bedside reports pt lived by self prior to admission for Digoxin toxicity. Daughter continues to report pt walking Thursday but has been in the bed since. Daughter states bilateral LE swelling has been present but new swelling to bilateral UE.

## 2015-08-18 NOTE — ED Notes (Signed)
Per daughter pt "seems like he feels a lot better now."

## 2015-08-18 NOTE — ED Notes (Signed)
Per EMS pt sent from [facility] for evaluation weakness and altered mental status. This nurse called [place] to acquire more information. Per [place] nurse pt low blood pressure on Friday; MD discontinued blood pressure. Pt worsening condition since Friday; sluggish, weak, and anorexia. With PT today pt blood pressure dropped to the 70s.

## 2015-08-18 NOTE — ED Notes (Signed)
Per DG IV removed/came out with pt transfer when pt raised arm up.

## 2015-08-18 NOTE — ED Notes (Signed)
I attempted twice to collect labs and was unsuccessful 

## 2015-08-18 NOTE — ED Notes (Signed)
Patient transported to X-ray 

## 2015-08-18 NOTE — ED Notes (Signed)
Pt in CT at present time. In and out to be completed with pt return.

## 2015-08-18 NOTE — Progress Notes (Signed)
Pharmacy Antibiotic Note  Kyle Parrish is a 82 y.o. male admitted on 08/18/2015 with pneumonia.  He was hospitalized 5/27-->5/31 with hypoglycemia and discharged to a rehab facility.   He presents today with AMS.  Pharmacy has been consulted for Vancomycin & Zosyn dosing. Initial doses given in ED.   08/18/2015:   Afebrile, WBC wnl  Lactic acid elevated (4.06), PCT level pending  CXR, abdominal CT+ bilateral atelectasis vs PNA  Acute on Chronic renal failure.  Estimated CrCl <10.  Plan:  Zosyn 2.5gm IV q8h  Check random Vanc level ~48hrs after initial dose to assess clearance & order subsequent doses prn Monitor renal function and cx data   Weight: 134 lb 9.6 oz (61.054 kg)  Temp (24hrs), Avg:97.5 F (36.4 C), Min:97.5 F (36.4 C), Max:97.5 F (36.4 C)   Recent Labs Lab 08/12/15 1121 08/13/15 0647 08/13/15 0648 08/18/15 1605 08/18/15 1629  WBC 9.5  --   --  9.8  --   CREATININE 3.31* 3.28* 3.32* 4.94* 5.00*  LATICACIDVEN  --   --   --   --  4.06*    Estimated Creatinine Clearance: 6.8 mL/min (by C-G formula based on Cr of 5).    No Known Allergies  Antimicrobials this admission: 6/5 Zosyn >>  6/5 Vanc >>   Dose adjustments this admission:  Microbiology results: 6/5 BCx: sent 6/5 S pneumo antigen: 6/5 Legionella antigen:  Thank you for allowing pharmacy to be a part of this patient's care.  Netta Cedars, PharmD, BCPS Pager: [phone] 08/18/2015 6:46 PM

## 2015-08-18 NOTE — H&P (Signed)
History and Physical    Kyle Parrish [id number] DOB: 21-May-1912 DOA: 08/18/2015  PCP: Wyatt Haste, MD   Patient coming from: SNF  Chief Complaint: AMS, hypotension  HPI: Kyle Parrish is a 82 y.o. male with medical history significant for type 2 diabetes mellitus, hypertension, coronary artery disease with stent, colon cancer, hypothyroidism, and paroxysmal atrial fibrillation and presents to the ED from his SNF with 4 days of generalized weakness, altered mental status, and hypotension. Patient was recently admitted to the hospital and discharged to SNF one week ago following management of AKI, hypotension, and hypoglycemia. Unfortunately, patient's condition has continued to decline at the SNF and he has been hypotensive despite discontinuation of his blood pressure medicines last week. He had lived alone before the recent hospitalization and had been ambulatory and able to work with PT until this past week. He is reportedly been bedbound since being at the SNF. PT attempted to work with the patient today but his blood pressure dropped to [id number] systolic and he was directed to the emergency department for further evaluation.  ED Course: Upon arrival to the ED, patient is found to be afebrile, saturating adequately on room air, with heart rate in the normal range and blood pressure of 84/52. EKG featured a sinus rhythm with nonspecific T-wave changes in the lateral leads and chest x-ray was notable for the interval development of pneumonia at the right lung base. Chemistry panel features a BUN of 105 and creatinine of 4.94, up from 72 and 3.32 just 6 days prior. CBC is notable for hemoglobin of 10.3, up from 9.2 one week ago. CBC also features a thrombocytopenia to 138,000, apparently stable relative to recent measurements. Lactic acid returned elevated to a value of 4.06 and troponin is also elevated to 0.11. A CT of the abdomen and pelvis was obtained and demonstrates a large  number of metastases throughout the liver as well as moderate size bilateral pleural effusions. Patient was given a 2 L normal saline bolus, blood cultures were obtained, and empiric treatment with vancomycin and Zosyn was administered. Patient's blood pressure improved to the 90s/50 range while still in the emergency department. He'll be admitted to the stepdown unit for ongoing evaluation and management of hypotension, acute renal failure, and acute encephalopathy suspected secondary to HCAP.  Review of Systems:  All other systems reviewed and apart from HPI, are negative.  Past Medical History  Diagnosis Date  . NIDDM (non-insulin dependent diabetes mellitus)   . Nephropathy   . History of ST elevation myocardial infarction (STEMI) of anterior wall June 2003    Anterior MI in the setting of GI bleed; initially treated medically; 10 weeks later underwent cardiac catheterization PCI the LAD.  Marland Kitchen CAD S/P percutaneous coronary angioplasty July 2003    PCI of the LAD - HepaCoat BMS 2.5 mm x 13 mm (3.7 mm).  . History of Ischemic cardiomyopathy Summer of 2003 - 2010    2003: Initial EF Post PCI was 40%; by Myoview in 2005 EF is 51% but no ischemia.;  No recurrence of heart failure or following PCI in 2003;; Echo 03/2008: Normal LV size & function, Gr 1 DD with elevated LAP, Mild Ao sclerosis  . Obesity   . Actinic keratoses   . Colon cancer ([place])   . Dyslipidemia   . Gout   . Hypothyroid   . Anemia   . 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 Disc herniation   .  Hard of hearing      very hard of hearing    Past Surgical History  Procedure Laterality Date  . Colectomy  2003  . Colonoscopy  2008  . Tte  2010  . Nm myoview ltd  2005  . Cardiac catheterization  2003     reports that he has been smoking Cigars.  He has never used smokeless tobacco. He reports that he does not drink alcohol or use illicit drugs.  No Known Allergies  Family History  Problem Relation Age of  Onset  . Family history unknown: Yes     Prior to Admission medications   Medication Sig Start Date End Date Taking? Authorizing Provider  aspirin 81 MG tablet Take 81 mg by mouth daily.     Yes Historical Provider, MD  carvedilol (COREG) 6.25 MG tablet TAKE ONE TABLET BY MOUTH ONCE DAILY Patient taking differently: TAKE 6.25 MG BY MOUTH ONCE DAILY 09/10/13  Yes Denita Lung, MD  Dorzolamide HCl-Timolol Mal PF 22.3-6.8 MG/ML SOLN Apply 1 drop to eye 2 (two) times daily.   Yes Historical Provider, MD  folic acid (FOLVITE) 1 MG tablet Take 1 tablet (1 mg total) by mouth daily. 02/04/15  Yes Denita Lung, MD  insulin aspart (NOVOLOG) 100 UNIT/ML injection Inject 0-9 Units into the skin 3 (three) times daily with meals. 08/13/15  Yes Nishant Dhungel, MD  latanoprost (XALATAN) 0.005 % ophthalmic solution Place 1 drop into both eyes at bedtime.   Yes Historical Provider, MD  levothyroxine (SYNTHROID, LEVOTHROID) 88 MCG tablet TAKE ONE TABLET BY MOUTH ONCE DAILY Patient taking differently: TAKE 88 MCG BY MOUTH ONCE DAILY 08/06/15  Yes Denita Lung, MD  Multiple Vitamins-Minerals (MENS MULTIVITAMIN PLUS PO) Take 1 tablet by mouth daily.    Yes Historical Provider, MD  sodium bicarbonate 650 MG tablet Take 1 tablet (650 mg total) by mouth daily. 08/12/15  Yes Nita Sells, MD  tamsulosin (FLOMAX) 0.4 MG CAPS capsule TAKE ONE CAPSULE BY MOUTH ONCE DAILY. Patient taking differently: TAKE 0.4 MG BY MOUTH ONCE DAILY. 07/11/15  Yes Denita Lung, MD  UNABLE TO FIND Med Name: Med pass 120 mL 2 times daily between meals   Yes Historical Provider, MD    Physical Exam: Filed Vitals:   08/18/15 1645 08/18/15 1700 08/18/15 1736 08/18/15 1800  BP: 90/52 99/61 96/49  94/42  Pulse: 69 66 58 58  Temp:      TempSrc:      Resp: 19  16 13   Weight:      SpO2: 98% 98% 100% 97%      Constitutional: Obtunded, in no apparent respiratory distress Eyes: PERTLA, lids and conjunctivae normal ENMT: Mucous  membranes are dry and tacky. Partially-chewed carrots in his mouth, removed. Posterior pharynx clear of any exudate or lesions.   Neck: normal, supple, no masses, no thyromegaly Respiratory: Coarse rhonchi b/l bases. Normal respiratory effort. No accessory muscle use.  Cardiovascular: S1 & S2 heard, regular rate and rhythm. 2+ pretibial edema b/l.  No significant JVD. Abdomen: No distension, no tenderness, no masses palpated. Bowel sounds normal.  Musculoskeletal: no clubbing / cyanosis. No joint deformity upper and lower extremities. Normal muscle tone.  Skin: no significant rashes, lesions, ulcers. Warm, dry, well-perfused. Neurologic: Obtunded, PERRL, moving all extremities spontaneously. Patellar DTR's 2+ and symmetric. Babinski is down-going b/l.  Psychiatric: Difficult to assess given the clinical scenario.     Labs on Admission: I have personally reviewed following labs and imaging studies  CBC:  Recent Labs Lab 08/12/15 1121 08/18/15 1605 08/18/15 1629  WBC 9.5 9.8  --   NEUTROABS 7.5 7.8*  --   HGB 9.2* 10.3* 10.9*  HCT 27.6* 29.6* 32.0*  MCV 92.3 89.7  --   PLT 118* 138*  --    Basic Metabolic Panel:  Recent Labs Lab 08/12/15 1121 08/13/15 0647 08/13/15 0648 08/18/15 1605 08/18/15 1629  NA 133* 136 135 135 135  K 4.3 4.4 4.4 4.3 4.3  CL 106 108 107 106 105  CO2 18* 18* 18* 15*  --   GLUCOSE 97 107* 110* 96 87  BUN 69* 71* 72* 105* 100*  CREATININE 3.31* 3.28* 3.32* 4.94* 5.00*  CALCIUM 8.3* 8.5* 8.5* 8.4*  --   PHOS  --  3.2  --   --   --    GFR: Estimated Creatinine Clearance: 6.8 mL/min (by C-G formula based on Cr of 5). Liver Function Tests:  Recent Labs Lab 08/12/15 1121 08/13/15 0647 08/13/15 0648 08/18/15 1605  AST 102*  --  95* 121*  ALT 47  --  48 55  ALKPHOS 314*  --  293* 345*  BILITOT 1.4*  --  1.4* 1.3*  PROT 4.7*  --  4.7* 5.0*  ALBUMIN 2.0* 2.0* 2.0* 2.1*   No results for input(s): LIPASE, AMYLASE in the last 168 hours. No  results for input(s): AMMONIA in the last 168 hours. Coagulation Profile:  Recent Labs Lab 08/13/15 0648  INR 1.23   Cardiac Enzymes: No results for input(s): CKTOTAL, CKMB, CKMBINDEX, TROPONINI in the last 168 hours. BNP (last 3 results) No results for input(s): PROBNP in the last 8760 hours. HbA1C: No results for input(s): HGBA1C in the last 72 hours. CBG:  Recent Labs Lab 08/13/15 0027 08/13/15 0427 08/13/15 0753 08/13/15 1137 08/18/15 1549  GLUCAP 156* 81 117* 154* 92   Lipid Profile: No results for input(s): CHOL, HDL, LDLCALC, TRIG, CHOLHDL, LDLDIRECT in the last 72 hours. Thyroid Function Tests: No results for input(s): TSH, T4TOTAL, FREET4, T3FREE, THYROIDAB in the last 72 hours. Anemia Panel: No results for input(s): VITAMINB12, FOLATE, FERRITIN, TIBC, IRON, RETICCTPCT in the last 72 hours. Urine analysis:    Component Value Date/Time   COLORURINE AMBER* 08/10/2015 0950   APPEARANCEUR CLOUDY* 08/10/2015 0950   LABSPEC 1.019 08/10/2015 0950   PHURINE 5.0 08/10/2015 0950   GLUCOSEU NEGATIVE 08/10/2015 0950   HGBUR NEGATIVE 08/10/2015 0950   BILIRUBINUR SMALL* 08/10/2015 0950   KETONESUR 15* 08/10/2015 0950   PROTEINUR 30* 08/10/2015 0950   UROBILINOGEN 0.2 04/03/2008 0841   NITRITE NEGATIVE 08/10/2015 0950   LEUKOCYTESUR SMALL* 08/10/2015 0950   Sepsis Labs: @LABRCNTIP (procalcitonin:4,lacticidven:4) ) Recent Results (from the past 240 hour(s))  Urine culture     Status: Abnormal   Collection Time: 08/10/15  9:50 AM  Result Value Ref Range Status   Specimen Description URINE, CLEAN CATCH  Final   Special Requests NONE  Final   Culture MULTIPLE SPECIES PRESENT, SUGGEST RECOLLECTION (A)  Final   Report Status 08/11/2015 FINAL  Final     Radiological Exams on Admission: Ct Abdomen Pelvis Wo Contrast  08/18/2015  CLINICAL DATA:  Weakness. Altered mental status. Hypotension. History of colon cancer and colectomy. EXAM: CT ABDOMEN AND PELVIS WITHOUT  CONTRAST TECHNIQUE: Multidetector CT imaging of the abdomen and pelvis was performed following the standard protocol without IV contrast. COMPARISON:  Abdomen ultrasound dated 08/12/2015. FINDINGS: Lower chest: Moderate size bilateral pleural effusions. Bilateral lower lobe atelectasis. Hepatobiliary: Large number  of masses throughout the liver. Large number of small gallstones in the gallbladder, all measuring less than 5 mm in diameter each. Some contain nitrogen. No gallbladder wall thickening or pericholecystic fluid. Pancreas: Diffuse atrophy of the neck and body of the pancreas. The head and tail are normal in caliber. No visible mass or stone. Spleen: Scattered tiny calcified granulomata or vascular calcifications. Normal in size. Adrenals/Urinary Tract: Normal appearing adrenal glands, kidneys, ureters and urinary bladder. No calculi or hydronephrosis. No visible mass. Stomach/Bowel: Surgically absent right colon and hepatic flexure with a small bowel to colonic anastomosis adjacent to the gallbladder there is also a probable gas-filled diverticulum adjacent to the gallbladder. No definite gas within the gallbladder. Vascular/Lymphatic: Extensive atheromatous arterial calcifications. These include dense coronary artery calcifications. No enlarged lymph nodes. Reproductive: Moderately enlarged prostate gland. Other: None. Musculoskeletal: Extensive lumbar and lower thoracic spine degenerative changes. IMPRESSION: 1. Large number of metastases throughout the liver. 2. Moderate-sized bilateral pleural effusions. 3. Bilateral lower lobe atelectasis. 4. Cholelithiasis. 5. Sigmoid diverticulosis. 6. Surgical absence of the right colon and hepatic flexure. 7. Dense atheromatous coronary artery calcifications. 8. Moderately enlarged prostate gland. Electronically Signed   By: Claudie Revering M.D.   On: 08/18/2015 17:50   Dg Chest 2 View  08/18/2015  CLINICAL DATA:  Weakness.  Hypotension.  Altered mental status.  EXAM: CHEST  2 VIEW COMPARISON:  08/10/2015. FINDINGS: Poor inspiration. Normal sized heart with an interval decrease in size. The moderate sized bilateral pleural effusions, increased. Interval patchy opacity at the right lung base and linear and mild patchy opacity at the left lung base. No acute bony abnormality. IMPRESSION: 1. Interval pneumonia or patchy atelectasis of the right lung base. 2. Left basilar atelectasis and possible pneumonia. 3. Moderate-sized bilateral pleural effusions, increased. Electronically Signed   By: Claudie Revering M.D.   On: 08/18/2015 16:32   Ct Head Wo Contrast  08/18/2015  CLINICAL DATA:  Altered mental status EXAM: CT HEAD WITHOUT CONTRAST TECHNIQUE: Contiguous axial images were obtained from the base of the skull through the vertex without intravenous contrast. COMPARISON:  None. FINDINGS: The bony calvarium is intact. No gross soft tissue abnormality is noted. Mild atrophic changes are identified stable from the prior exam. No findings to suggest acute hemorrhage, acute infarction or space-occupying mass lesion are noted. IMPRESSION: Chronic atrophic changes without acute abnormality. Electronically Signed   By: Inez Catalina M.D.   On: 08/18/2015 17:32    EKG: Independently reviewed. Sinus rhythm with non-specific ST-T wave changes in lateral leads  Assessment/Plan  1. Acute encephalopathy  - Likely toxic-metabolic in setting of uremia and HCAP   - Treating the underlying infection and renal failure as below  - No acute intracranial abnormalities on CT    2. Hypotension  - Suspected secondary to HCAP, poor PO intake  - SBP reportedly in 70s at SNF prior to admssion; SBP 84 on arrival  - Coreg discontinued at the SNF  - 2 L NS bolused in ED, will continue IVF with NS at 100/cc/hr for now  - Per discussion with patient's daughter, no escalation to pressors    3. HCAP  - CXR features new RLL opacity c/w PNA - Suspected secondary to aspiration; there is food in  his mouth in the ED - Treating empirically with vancomycin and Zosyn  - Blood cultures incubating; check urine antigens to strep pneumo and legionella  - Trend lactate, procalcitonin    4. Thrombocytopenia, normocytic anemia  - Hgb 10.3  on admission, up from 9.2 one week ago, likely secondary to hemoconcentration and drop to 9-range anticipated  - Platelet count 138,000 on admission, higher than recent priors  - No sign of active blood-loss, will monitor    5. AKI superimposed on CKD stage III  - SCr 4.94 on admission, up from 3.32 on 08/13/15  - Suspected secondary to hypotension; prerenal azotemia, possibly ATN  - Check urine studies   - Continue IVF resuscitation with NS at 100 cc/hr    6. Atrial fibrillation, paroxysmal  - In sinus rhythm at time of admission  - CHADS-VASc is 25 (age x2, HTN, CAD, DM, CHF); not anticoagulated, likely d/t bleeding risk  - Coreg discontinued d/t hypotension - Continue ASA 81 qD  - Monitoring on telemetry   7. CAD  - BMS placed to LAD in 2003 - Admission EKG with TWI's in lateral leads and troponin elevated to 0.11, see below  - Continue ASA 81 mg qD   8. Hypothyroidism  - Given the presentation, will check TSH  - Continue current-dose Synthroid for now    9. Elevated troponin  - Troponin elevated to 0.11 in ED non-specific ST-T wave changes in lateral leads on EKG  - Suspect this represents demand ischemia in the setting of HCAP and hypotension rather than acute coronary occlusion  - Monitor on telemetry for ischemic changes  - Trend serial troponin measurements  10. Type II DM  - A1c 8.5% in May 2017, reflecting sub-optimal control  - Previously managed with metformin and Troujeo, changed to Novolog sliding-scale at time of recent discharge   - Check CBG q4h, change to with meals and qHS once tolerating diet  - Low-intensity SSI only for now, adjust prn    DVT prophylaxis: sq heparin  Code Status: DNR  Family Communication: Daughter  updated at bedside  Disposition Plan: Admit to stepdown  Consults called: None  Admission status: Inpatient    Vianne Bulls, MD [facility] Pager [phone]  If 7PM-7AM, please contact night-coverage [email or web address] Password [facility]  08/18/2015, 6:45 PM

## 2015-08-19 LAB — CBC WITH DIFFERENTIAL/PLATELET
Basophils Absolute: 0 10*3/uL (ref 0.0–0.1)
Basophils Relative: 0 %
EOS PCT: 1 %
Eosinophils Absolute: 0.1 10*3/uL (ref 0.0–0.7)
HCT: 29.1 % — ABNORMAL LOW (ref 39.0–52.0)
Hemoglobin: 10.3 g/dL — ABNORMAL LOW (ref 13.0–17.0)
LYMPHS ABS: 0.9 10*3/uL (ref 0.7–4.0)
LYMPHS PCT: 10 %
MCH: 32 pg (ref 26.0–34.0)
MCHC: 35.4 g/dL (ref 30.0–36.0)
MCV: 90.4 fL (ref 78.0–100.0)
MONO ABS: 0.5 10*3/uL (ref 0.1–1.0)
MONOS PCT: 6 %
Neutro Abs: 8.1 10*3/uL — ABNORMAL HIGH (ref 1.7–7.7)
Neutrophils Relative %: 84 %
PLATELETS: 118 10*3/uL — AB (ref 150–400)
RBC: 3.22 MIL/uL — AB (ref 4.22–5.81)
RDW: 20.1 % — AB (ref 11.5–15.5)
WBC: 9.6 10*3/uL (ref 4.0–10.5)

## 2015-08-19 LAB — COMPREHENSIVE METABOLIC PANEL
ALBUMIN: 1.9 g/dL — AB (ref 3.5–5.0)
ALT: 59 U/L (ref 17–63)
AST: 143 U/L — AB (ref 15–41)
Alkaline Phosphatase: 304 U/L — ABNORMAL HIGH (ref 38–126)
Anion gap: 13 (ref 5–15)
BUN: 110 mg/dL — AB (ref 6–20)
CHLORIDE: 109 mmol/L (ref 101–111)
CO2: 13 mmol/L — AB (ref 22–32)
Calcium: 7.9 mg/dL — ABNORMAL LOW (ref 8.9–10.3)
Creatinine, Ser: 4.52 mg/dL — ABNORMAL HIGH (ref 0.61–1.24)
GFR calc Af Amer: 11 mL/min — ABNORMAL LOW (ref >60)
GFR calc non Af Amer: 10 mL/min — ABNORMAL LOW (ref >60)
GLUCOSE: 131 mg/dL — AB (ref 65–99)
POTASSIUM: 4.7 mmol/L (ref 3.5–5.1)
Sodium: 135 mmol/L (ref 135–145)
Total Bilirubin: 1.6 mg/dL — ABNORMAL HIGH (ref 0.3–1.2)
Total Protein: 4.5 g/dL — ABNORMAL LOW (ref 6.5–8.1)

## 2015-08-19 LAB — GLUCOSE, CAPILLARY
GLUCOSE-CAPILLARY: 126 mg/dL — AB (ref 65–99)
GLUCOSE-CAPILLARY: 132 mg/dL — AB (ref 65–99)
GLUCOSE-CAPILLARY: 133 mg/dL — AB (ref 65–99)
GLUCOSE-CAPILLARY: 138 mg/dL — AB (ref 65–99)
Glucose-Capillary: 107 mg/dL — ABNORMAL HIGH (ref 65–99)
Glucose-Capillary: 141 mg/dL — ABNORMAL HIGH (ref 65–99)

## 2015-08-19 LAB — CORTISOL: Cortisol, Plasma: 17.7 ug/dL

## 2015-08-19 LAB — CREATININE, URINE, RANDOM: Creatinine, Urine: 87.76 mg/dL

## 2015-08-19 LAB — LACTIC ACID, PLASMA: Lactic Acid, Venous: 2.1 mmol/L (ref 0.5–2.0)

## 2015-08-19 LAB — NA AND K (SODIUM & POTASSIUM), RAND UR
Potassium Urine: 41 mmol/L
SODIUM UR: 27 mmol/L

## 2015-08-19 LAB — TROPONIN I
Troponin I: 0.1 ng/mL — ABNORMAL HIGH (ref <0.031)
Troponin I: 0.09 ng/mL — ABNORMAL HIGH (ref <0.031)

## 2015-08-19 LAB — STREP PNEUMONIAE URINARY ANTIGEN: STREP PNEUMO URINARY ANTIGEN: NEGATIVE

## 2015-08-19 LAB — HIV ANTIBODY (ROUTINE TESTING W REFLEX): HIV SCREEN 4TH GENERATION: NONREACTIVE

## 2015-08-19 MED ORDER — SODIUM CHLORIDE 0.9 % IV SOLN
INTRAVENOUS | Status: AC
Start: 2015-08-19 — End: 2015-08-20
  Administered 2015-08-20: 03:00:00 via INTRAVENOUS

## 2015-08-19 MED ORDER — INSULIN ASPART 100 UNIT/ML ~~LOC~~ SOLN
0.0000 [IU] | Freq: Four times a day (QID) | SUBCUTANEOUS | Status: DC
  Administered 2015-08-19 – 2015-08-21 (×2): 1 [IU] via SUBCUTANEOUS

## 2015-08-19 NOTE — Progress Notes (Signed)
CRITICAL VALUE ALERT  Critical value received:  Lactic acid 2.1  Date of notification:  08/18/15  Time of notification:  2206  Critical value read back:Yes.    Nurse who received alert:  Ellen Henri  MD notified (1st page):  Dr Myna Hidalgo  Time of first page:  2240  MD notified (2nd page):  Time of second page:  Responding MD:  Dr Myna Hidalgo  Time MD responded:  2306

## 2015-08-19 NOTE — Progress Notes (Signed)
Palliative Medicine Team consult was received.   I called to set up meeting with patient's daughter, however, we were cut off during conversation.  I called back and LMOVM asking her to return call to set up time for family meeting tomorrow.  We will schedule a meeting with patient and family at the earliest possible time we have a provider available and family can meet.   If there are urgent needs or questions please call [phone]. Thank you for consulting our team to assist with this patients care.  Micheline Rough, MD [place] Team [phone]

## 2015-08-19 NOTE — Evaluation (Signed)
Clinical/Bedside Swallow Evaluation Patient Details  Name: Kyle Parrish MRN: [id number] Date of Birth: Apr 11, 1912  Today's Date: 08/19/2015 Time: SLP Start Time (ACUTE ONLY): 70 SLP Stop Time (ACUTE ONLY): 1126 SLP Time Calculation (min) (ACUTE ONLY): 15 min  Past Medical History:  Past Medical History  Diagnosis Date  . NIDDM (non-insulin dependent diabetes mellitus)   . Nephropathy   . History of ST elevation myocardial infarction (STEMI) of anterior wall June 2003    Anterior MI in the setting of GI bleed; initially treated medically; 10 weeks later underwent cardiac catheterization PCI the LAD.  Marland Kitchen CAD S/P percutaneous coronary angioplasty July 2003    PCI of the LAD - HepaCoat BMS 2.5 mm x 13 mm (3.7 mm).  . History of Ischemic cardiomyopathy Summer of 2003 - 2010    2003: Initial EF Post PCI was 40%; by Myoview in 2005 EF is 51% but no ischemia.;  No recurrence of heart failure or following PCI in 2003;; Echo 03/2008: Normal LV size & function, Gr 1 DD with elevated LAP, Mild Ao sclerosis  . Obesity   . Actinic keratoses   . Colon cancer ([place])   . Dyslipidemia   . Gout   . Hypothyroid   . Anemia   . 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 Disc herniation   . Hard of hearing      very hard of hearing   Past Surgical History:  Past Surgical History  Procedure Laterality Date  . Colectomy  2003  . Colonoscopy  2008  . Tte  2010  . Nm myoview ltd  2005  . Cardiac catheterization  2003   HPI:  82 yo male adm to [facility] with 4 days of weakness.  Pt diagnosed with aspiration pna.  PMH + for dysphagia with diet being soft/nectar at facility, CT abdomen with liver mets and moderate pleural effusion.  Pt with poor prognosis per notes and palliative referral pending.  Swallow evaluation.     Assessment / Plan / Recommendation Clinical Impression  Pt is grossly weak, dysarthric but participative.  RN reports pt had retained mashed potatoes mixed with carrots in oral  cavity when she provided oral care.  Note dry lingua musculature = coating and pt is xerostomic.  Pt presents with symptoms consistent with moderate oral and severe pharyngeal dysphagia.  Pt with decreased labial seal due to gross weakness likely contributing to poor oral propulsion.  In addition, he demonstrates multiple swallows x4-5 with poor laryngeal elevation palpated across all consistencies tested.  Pt would only accept single ice bolus, thin water via tsp, nectar via tsp/cup before stating "I'm done, that's enough".  Pt with overt cough immediate post=swallow of tsp water concerning for aspiration.  Pt denies desire to consume po intake = and stated only "every once in a while".  Note pt for palliative referral, pt is high aspiration risk with any po intake at this time.  Recommend npo except ice and room temp water via tsp for comfort, oral hygeine.  SLP to follow up to aid in pt care plan after pallaitive meeting.  Do not anticipate mbs would change pt's outcomes at this time.  Educated pt to findings/recommendations but uncertain to his comprehension.       Aspiration Risk  Severe aspiration risk;Risk for inadequate nutrition/hydration    Diet Recommendation NPO;Other (Comment) (ice chips, tsps water *room temp) pending GOC         Other  Recommendations  Oral Care Recommendations: Oral care QID   Follow up Recommendations     tbd  Frequency and Duration min 1 x/week  1 week       Prognosis Prognosis for Safe Diet Advancement: Guarded Barriers to Reach Goals: Severity of deficits      Swallow Study   General Date of Onset: 08/19/15 HPI: 82 yo male adm to [facility] with 4 days of weakness.  Pt diagnosed with aspiration pna.  PMH + for dysphagia with diet being soft/nectar at facility, CT abdomen with liver mets and moderate pleural effusion.  Pt with poor prognosis per notes and palliative referral pending.  Swallow evaluation.   Type of Study: Bedside Swallow Evaluation Diet Prior to  this Study: NPO Temperature Spikes Noted: No Respiratory Status: Room air History of Recent Intubation: No Behavior/Cognition: Alert;Cooperative;Pleasant mood Oral Cavity Assessment: Dry Oral Care Completed by SLP: No Oral Cavity - Dentition: Dentures, top;Missing dentition Vision: Functional for self-feeding Self-Feeding Abilities: Total assist Patient Positioning: Upright in bed;Partially reclined (pt would not allow full HOB elevation) Baseline Vocal Quality: Low vocal intensity;Hoarse Volitional Cough: Strong Volitional Swallow: Able to elicit    Oral/Motor/Sensory Function Overall Oral Motor/Sensory Function: Generalized oral weakness (generalized weakness)   Ice Chips Ice chips: Impaired Presentation: Spoon Oral Phase Impairments: Reduced labial seal Oral Phase Functional Implications: Prolonged oral transit;Oral holding Pharyngeal Phase Impairments: Suspected delayed Swallow;Decreased hyoid-laryngeal movement;Multiple swallows   Thin Liquid Thin Liquid: Impaired Presentation: Spoon Oral Phase Impairments: Reduced labial seal Pharyngeal  Phase Impairments: Decreased hyoid-laryngeal movement;Multiple swallows;Cough - Immediate    Nectar Thick Nectar Thick Liquid: Impaired Presentation: Cup;Spoon Oral Phase Impairments: Reduced lingual movement/coordination;Reduced labial seal Oral phase functional implications: Prolonged oral transit Pharyngeal Phase Impairments: Suspected delayed Swallow;Decreased hyoid-laryngeal movement;Multiple swallows;Throat Clearing - Delayed   Honey Thick Honey Thick Liquid: Not tested   Puree Puree: Not tested Other Comments: pt declined to consume more stating "that's enough for now"   Solid   GO   Solid: Not tested Other Comments: pt declined to consume more stating "that's enough for now"          Luanna Salk, [place] [facility] SLP [phone]

## 2015-08-19 NOTE — NC FL2 (Signed)
[place] LEVEL OF CARE SCREENING TOOL     IDENTIFICATION  Patient Name: Kyle Parrish Birthdate: 26-Dec-1912 Sex: male Admission Date (Current Location): 08/18/2015  [facility] and [place] Number:  Herbalist and Address:  [facility],  [place] [address], [place]      Provider Number: [id number]  Attending Physician Name and Address:  Thurnell Lose, MD  Relative Name and Phone Number:       Current Level of Care: Hospital Recommended Level of Care: [place] Prior Approval Number:    Date Approved/Denied:   PASRR Number: [id number]  Discharge Plan: SNF    Current Diagnoses: Patient Active Problem List   Diagnosis Date Noted  . Aspiration pneumonia ([place]) 08/18/2015  . Normocytic anemia 08/18/2015  . Hypotension 08/18/2015  . Paroxysmal atrial fibrillation ([place]) 08/18/2015  . Acute renal failure superimposed on stage 3 chronic kidney disease ([place]) 08/13/2015  . Cirrhosis of liver ([place]) 08/13/2015  . Thrombocytopenia ([place]) 08/13/2015  . Hypoglycemia 08/09/2015  . Elevated troponin 08/09/2015  . Hyperkalemia 08/09/2015  . Hyponatremia 08/09/2015  . Arthritis 05/02/2013  . History of Ischemic cardiomyopathy   . Hyperlipidemia associated with type 2 diabetes mellitus ([place]) 04/06/2011  . Type 2 diabetes with complication ([place]) [id number]  . Hypertension associated with diabetes ([place]) 08/03/2010  . Hypothyroid 08/03/2010  . History of colon cancer 08/03/2010  . Tinea pedis 07/09/2010  . Coronary artery disease, with PCI of the LAD; following an anterior MI treated medically. 09/02/2001    Orientation RESPIRATION BLADDER Height & Weight     Self, Place  Normal Incontinent Weight: 140 lb 3.4 oz (63.6 kg) Height:  5\' 7"  (170.2 cm)  BEHAVIORAL SYMPTOMS/MOOD NEUROLOGICAL BOWEL NUTRITION STATUS      Incontinent Diet (NPO;Other (Comment) (ice chips, tsps water *room temp) pending GOC)  AMBULATORY STATUS  COMMUNICATION OF NEEDS Skin   Extensive Assist Verbally Normal                       Personal Care Assistance Level of Assistance  Bathing, Feeding, Dressing Bathing Assistance: Limited assistance Feeding assistance: Limited assistance Dressing Assistance: Limited assistance     Functional Limitations Info  Sight, Hearing, Speech Sight Info: Impaired Hearing Info: Impaired Speech Info: Adequate    SPECIAL CARE FACTORS FREQUENCY  PT (By licensed PT), OT (By licensed OT)     PT Frequency: 5x OT Frequency: 5x            Contractures Contractures Info: Not present    Additional Factors Info  Code Status, Allergies, Insulin Sliding Scale Code Status Info: DNR Allergies Info: NKA   Insulin Sliding Scale Info: 6/day       Current Medications (08/19/2015):  This is the current hospital active medication list Current Facility-Administered Medications  Medication Dose Route Frequency Provider Last Rate Last Dose  . 0.9 %  sodium chloride infusion   Intravenous Continuous Thurnell Lose, MD 75 mL/hr at 08/19/15 1015    . aspirin chewable tablet 81 mg  81 mg Oral Daily Vianne Bulls, MD   81 mg at 08/19/15 1000  . dorzolamide-timolol (COSOPT) 22.3-6.8 MG/ML ophthalmic solution 1 drop  1 drop Both Eyes BID Vianne Bulls, MD   1 drop at 08/19/15 0847  . folic acid (FOLVITE) tablet 1 mg  1 mg Oral Daily Vianne Bulls, MD   1 mg at 08/19/15 1000  . heparin injection 5,000 Units  5,000 Units Subcutaneous Q8H Vianne Bulls, MD   5,000 Units at 08/19/15 1348  . insulin aspart (novoLOG) injection 0-9 Units  0-9 Units Subcutaneous Q6H Thurnell Lose, MD   0 Units at 08/19/15 1400  . latanoprost (XALATAN) 0.005 % ophthalmic solution 1 drop  1 drop Both Eyes QHS Vianne Bulls, MD   1 drop at 08/18/15 2315  . levothyroxine (SYNTHROID, LEVOTHROID) tablet 88 mcg  88 mcg Oral QAC breakfast Vianne Bulls, MD   88 mcg at 08/19/15 0800  . multivitamin with minerals tablet 1 tablet  1  tablet Oral Daily Vianne Bulls, MD   1 tablet at 08/19/15 1000  . piperacillin-tazobactam (ZOSYN) IVPB 2.25 g  2.25 g Intravenous Q8H Baltazar Najjar [place], RPH   2.25 g at 08/19/15 0836  . tamsulosin (FLOMAX) capsule 0.4 mg  0.4 mg Oral QPC breakfast Vianne Bulls, MD   0.4 mg at 08/19/15 0900     Discharge Medications: Please see discharge summary for a list of discharge medications.  Relevant Imaging Results:  Relevant Lab Results:   Additional Information SS # [id number]  Lilly [place], LCSW

## 2015-08-19 NOTE — Progress Notes (Signed)
PROGRESS NOTE                                                                                                                                                                                                             Patient Demographics:    Kyle Parrish, is a 82 y.o. male, DOB - February 17, 1913, [id number]  Admit date - 08/18/2015   Admitting Physician Vianne Bulls, MD  Outpatient Primary MD for the patient is Wyatt Haste, MD  LOS - 1  Chief Complaint  Patient presents with  . Weakness  . Altered Mental Status       Brief Narrative    Kyle Parrish is a 82 y.o. male with medical history significant for type 2 diabetes mellitus, hypertension, coronary artery disease with stent, colon cancer, hypothyroidism, and paroxysmal atrial fibrillation and presents to the ED from his SNF with 4 days of generalized weakness, altered mental status, and hypotension. Patient was recently admitted to the hospital and discharged to SNF one week ago following management of AKI, hypotension, and hypoglycemia. Unfortunately, patient's condition has continued to decline at the SNF and he has been hypotensive despite discontinuation of his blood pressure medicines last week. He had lived alone before the recent hospitalization and had been ambulatory and able to work with PT until this past week. He is reportedly been bedbound since being at the SNF. PT attempted to work with the patient today but his blood pressure dropped to [id number] systolic and he was directed to the emergency department for further evaluation.  In the ED workup consistent with sepsis with acute renal failure due to pneumonia possibly due to aspiration, he was also found to have acute renal failure on chronic kidney disease stage V. He is encephalopathic at this time, per daughter he is  DO NOT RESUSCITATE, with his underlying metastatic colon cancer his long-term  prognosis appears poor. We'll continue medical treatment and also involve palliative care for goals of care.    Subjective:    Kyle Parrish today has, No headache, No chest pain, No abdominal pain - No Nausea, No new weakness tingling or numbness, No Cough - SOB. Denies everything but is an unreliable historian.   Assessment  & Plan :    1.Sepsis with shock and acute renal failure due to pneumonia, possible ongoing aspiration. Keep nothing  by mouth, speech to evaluate, continue IV fluids after sufficient bolus, continue empiric antibiotics which are IV vancomycin and Zosyn, currently DO NOT RESUSCITATE, long-term prognosis not good will involve palliative care for goals of care as well.  2. ARF on CK D stage V. Baseline creatinine close to 3. Hydrate and monitor. Renal function improving, check a baseline bladder scan.  3. Toxic encephalopathy due to #1 and 2. Supportive care as above and monitor. Head CT unremarkable. No focal deficits. Speech to evaluate him soon for now nothing by mouth.  4. Paroxysmal atrial fibrillation. [place] vasc 2 score of 6. Not on chronic anticoagulation due to fall and bleeding risk, continue aspirin, Coreg held due to soft blood pressure from sepsis.  5. DM type II. For now on sliding scale. Has had issues with hypoglycemia recently.  CBG (last 3)   Recent Labs  08/19/15 0001 08/19/15 0352 08/19/15 0841  GLUCAP 132* 133* 107*    6.Hypothyroidism. Continue Synthroid. If remains nothing by mouth for a long time we'll switch to IV.  7.BPH on Flomax.  8. Mildly elevated troponin due to demand ischemia from sepsis. Troponin trend flat. Continue aspirin, once blood pressure improves resume Coreg. No further workup.  9. A OCD. Stable.  10. Mild thrombocytopenia due to underlying cirrhosis.  11. CAD - no acute issues. Continue aspirin, once blood pressure stable resume beta blocker.  12. HX of colon cancer, CT scan here reveals possible metastasis -  called daughter on the listed work phone number to get more history but not available, will retry.    Code Status :  DO NOT RESUSCITATE  Family Communication  :  None present  Disposition Plan  :  SNF with possible palliative care  Consults  : Palliative care  Procedures  :   CT head. Nonacute  CT abdomen and pelvis. Multiple liver metastases, bilateral pleural effusions, gallstones  Chest x-ray. Bilateral infiltrates  DVT Prophylaxis  :   Heparin   Lab Results  Component Value Date   PLT 118* 08/19/2015    Inpatient Medications  Scheduled Meds: . aspirin  81 mg Oral Daily  . dorzolamide-timolol  1 drop Both Eyes BID  . folic acid  1 mg Oral Daily  . heparin  5,000 Units Subcutaneous Q8H  . insulin aspart  0-9 Units Subcutaneous Q4H  . latanoprost  1 drop Both Eyes QHS  . levothyroxine  88 mcg Oral QAC breakfast  . multivitamin with minerals  1 tablet Oral Daily  . piperacillin-tazobactam (ZOSYN)  IV  2.25 g Intravenous Q8H  . tamsulosin  0.4 mg Oral QPC breakfast   Continuous Infusions: . sodium chloride     PRN Meds:.  Antibiotics  :    Anti-infectives    Start     Dose/Rate Route Frequency Ordered Stop   08/18/15 2359  piperacillin-tazobactam (ZOSYN) IVPB 2.25 g     2.25 g 100 mL/hr over 30 Minutes Intravenous Every 8 hours 08/18/15 1905     08/18/15 1545  vancomycin (VANCOCIN) 1,250 mg in sodium chloride 0.9 % 250 mL IVPB     1,250 mg 166.7 mL/hr over 90 Minutes Intravenous  Once 08/18/15 1540 08/18/15 1909   08/18/15 1545  piperacillin-tazobactam (ZOSYN) IVPB 3.375 g     3.375 g 100 mL/hr over 30 Minutes Intravenous  Once 08/18/15 1540 08/18/15 1739         Objective:   Filed Vitals:   08/19/15 0049 08/19/15 0344 08/19/15 0357 08/19/15 0411  BP:  92/40 105/44  Pulse:   60 60  Temp: 95.1 F (35.1 C) 95.3 F (35.2 C)    TempSrc: Rectal Rectal    Resp:   20   Height:      Weight:      SpO2:   100%     Wt Readings from Last 3  Encounters:  08/18/15 63.6 kg (140 lb 3.4 oz)  08/15/15 58.514 kg (129 lb)  08/13/15 61.281 kg (135 lb 1.6 oz)     Intake/Output Summary (Last 24 hours) at 08/19/15 1013 Last data filed at 08/19/15 0728  Gross per 24 hour  Intake 1271.67 ml  Output    301 ml  Net 970.67 ml     Physical Exam  Awake,But extremely confused, No new F.N deficits, Normal affect [place].AT,PERRAL Supple Neck,No JVD, No cervical lymphadenopathy appriciated.  Symmetrical Chest wall movement, Good air movement bilaterally, CTAB RRR,No Gallops,Rubs or new Murmurs, No Parasternal Heave +ve B.Sounds, Abd Soft, No tenderness, No organomegaly appriciated, No rebound - guarding or rigidity. Condom catheter in place No Cyanosis, Clubbing or edema, No new Rash or bruise       Data Review:    CBC  Recent Labs Lab 08/12/15 1121 08/18/15 1605 08/18/15 1629 08/19/15 0421  WBC 9.5 9.8  --  9.6  HGB 9.2* 10.3* 10.9* 10.3*  HCT 27.6* 29.6* 32.0* 29.1*  PLT 118* 138*  --  118*  MCV 92.3 89.7  --  90.4  MCH 30.8 31.2  --  32.0  MCHC 33.3 34.8  --  35.4  RDW 17.6* 19.6*  --  20.1*  LYMPHSABS 1.2 1.2  --  0.9  MONOABS 0.8 0.7  --  0.5  EOSABS 0.0 0.1  --  0.1  BASOSABS 0.0 0.0  --  0.0    Chemistries   Recent Labs Lab 08/12/15 1121 08/13/15 0647 08/13/15 0648 08/18/15 1605 08/18/15 1629 08/19/15 0421  NA 133* 136 135 135 135 135  K 4.3 4.4 4.4 4.3 4.3 4.7  CL 106 108 107 106 105 109  CO2 18* 18* 18* 15*  --  13*  GLUCOSE 97 107* 110* 96 87 131*  BUN 69* 71* 72* 105* 100* 110*  CREATININE 3.31* 3.28* 3.32* 4.94* 5.00* 4.52*  CALCIUM 8.3* 8.5* 8.5* 8.4*  --  7.9*  AST 102*  --  95* 121*  --  143*  ALT 47  --  48 55  --  59  ALKPHOS 314*  --  293* 345*  --  304*  BILITOT 1.4*  --  1.4* 1.3*  --  1.6*   ------------------------------------------------------------------------------------------------------------------ No results for input(s): CHOL, HDL, LDLCALC, TRIG, CHOLHDL, LDLDIRECT in the  last 72 hours.  Lab Results  Component Value Date   HGBA1C 8.5* 08/10/2015   ------------------------------------------------------------------------------------------------------------------ No results for input(s): TSH, T4TOTAL, T3FREE, THYROIDAB in the last 72 hours.  Invalid input(s): FREET3 ------------------------------------------------------------------------------------------------------------------ No results for input(s): VITAMINB12, FOLATE, FERRITIN, TIBC, IRON, RETICCTPCT in the last 72 hours.  Coagulation profile  Recent Labs Lab 08/13/15 0648  INR 1.23    No results for input(s): DDIMER in the last 72 hours.  Cardiac Enzymes  Recent Labs Lab 08/18/15 2102 08/19/15 0344  TROPONINI 0.10* 0.10*   ------------------------------------------------------------------------------------------------------------------ No results found for: BNP  Micro Results Recent Results (from the past 240 hour(s))  Urine culture     Status: Abnormal   Collection Time: 08/10/15  9:50 AM  Result Value Ref Range Status   Specimen Description URINE, CLEAN CATCH  Final   Special Requests NONE  Final   Culture MULTIPLE SPECIES PRESENT, SUGGEST RECOLLECTION (A)  Final   Report Status 08/11/2015 FINAL  Final  Blood culture (routine x 2)     Status: None (Preliminary result)   Collection Time: 08/18/15  4:09 PM  Result Value Ref Range Status   Specimen Description BLOOD LEFT ANTECUBITAL  Final   Special Requests BOTTLES DRAWN AEROBIC AND ANAEROBIC 5CC  Final   Culture PENDING  Incomplete   Report Status PENDING  Incomplete  MRSA PCR Screening     Status: None   Collection Time: 08/18/15  8:46 PM  Result Value Ref Range Status   MRSA by PCR NEGATIVE NEGATIVE Final    Comment:        The GeneXpert MRSA Assay (FDA approved for NASAL specimens only), is one component of a comprehensive MRSA colonization surveillance program. It is not intended to diagnose MRSA infection nor to  guide or monitor treatment for MRSA infections.     Radiology Reports Ct Abdomen Pelvis Wo Contrast  08/18/2015  CLINICAL DATA:  Weakness. Altered mental status. Hypotension. History of colon cancer and colectomy. EXAM: CT ABDOMEN AND PELVIS WITHOUT CONTRAST TECHNIQUE: Multidetector CT imaging of the abdomen and pelvis was performed following the standard protocol without IV contrast. COMPARISON:  Abdomen ultrasound dated 08/12/2015. FINDINGS: Lower chest: Moderate size bilateral pleural effusions. Bilateral lower lobe atelectasis. Hepatobiliary: Large number of masses throughout the liver. Large number of small gallstones in the gallbladder, all measuring less than 5 mm in diameter each. Some contain nitrogen. No gallbladder wall thickening or pericholecystic fluid. Pancreas: Diffuse atrophy of the neck and body of the pancreas. The head and tail are normal in caliber. No visible mass or stone. Spleen: Scattered tiny calcified granulomata or vascular calcifications. Normal in size. Adrenals/Urinary Tract: Normal appearing adrenal glands, kidneys, ureters and urinary bladder. No calculi or hydronephrosis. No visible mass. Stomach/Bowel: Surgically absent right colon and hepatic flexure with a small bowel to colonic anastomosis adjacent to the gallbladder there is also a probable gas-filled diverticulum adjacent to the gallbladder. No definite gas within the gallbladder. Vascular/Lymphatic: Extensive atheromatous arterial calcifications. These include dense coronary artery calcifications. No enlarged lymph nodes. Reproductive: Moderately enlarged prostate gland. Other: None. Musculoskeletal: Extensive lumbar and lower thoracic spine degenerative changes. IMPRESSION: 1. Large number of metastases throughout the liver. 2. Moderate-sized bilateral pleural effusions. 3. Bilateral lower lobe atelectasis. 4. Cholelithiasis. 5. Sigmoid diverticulosis. 6. Surgical absence of the right colon and hepatic flexure. 7.  Dense atheromatous coronary artery calcifications. 8. Moderately enlarged prostate gland. Electronically Signed   By: Claudie Revering M.D.   On: 08/18/2015 17:50   Dg Chest 2 View  08/18/2015  CLINICAL DATA:  Weakness.  Hypotension.  Altered mental status. EXAM: CHEST  2 VIEW COMPARISON:  08/10/2015. FINDINGS: Poor inspiration. Normal sized heart with an interval decrease in size. The moderate sized bilateral pleural effusions, increased. Interval patchy opacity at the right lung base and linear and mild patchy opacity at the left lung base. No acute bony abnormality. IMPRESSION: 1. Interval pneumonia or patchy atelectasis of the right lung base. 2. Left basilar atelectasis and possible pneumonia. 3. Moderate-sized bilateral pleural effusions, increased. Electronically Signed   By: Claudie Revering M.D.   On: 08/18/2015 16:32    Ct Head Wo Contrast  08/18/2015  CLINICAL DATA:  Altered mental status EXAM: CT HEAD WITHOUT CONTRAST TECHNIQUE: Contiguous axial images were obtained from the base of the skull through the vertex  without intravenous contrast. COMPARISON:  None. FINDINGS: The bony calvarium is intact. No gross soft tissue abnormality is noted. Mild atrophic changes are identified stable from the prior exam. No findings to suggest acute hemorrhage, acute infarction or space-occupying mass lesion are noted. IMPRESSION: Chronic atrophic changes without acute abnormality. Electronically Signed   By: Inez Catalina M.D.   On: 08/18/2015 17:32      Time Spent in minutes  30   Lorik Guo K M.D on 08/19/2015 at 10:13 AM  Between 7am to 7pm - Pager - [phone]  After 7pm go to [email or web address] - password [facility]  [facility] -  Office  [phone]

## 2015-08-19 NOTE — Progress Notes (Addendum)
Please see full assessment that was completed by [facility] SW: on 5/29 with admission and DC to SNF: [facility]. LCSW made contact with SNF with regards to placement as he is short term with no barriers to return at DC. Daughter has been involved and at the bedside, agreeable to plan to return to SNF at DC.  No concerns with care at this time other than his acute medical issues.   LCSW updated FL2.   Patient has been in SNF since he discharged from [facility] on 5/31. Per notes, palliative will be involved to assist with goals of care. LCSW will follow acutely and assist with disposition once patient is medically stable. Lane Hacker, MSW Clinical Social Work: [place] [phone]  Patient Information    Patient Name Sex DOB SSN   Kyle Parrish, Upadhyay Male 1912-07-10 [id number]    Clinical Social Work Note by Cranford Mon, LCSW at 08/11/2015 11:38 AM    Author: Cranford Mon, LCSW Service: Clinical Social Work Chief Strategy Officer Type: Social Worker   Filed: 08/11/2015 11:42 AM Note Time: 08/11/2015 11:38 AM Status: Signed   Editor: Cranford Mon, LCSW (Social Worker)     Expand All Collapse All   Clinical Social Work Assessment  Patient Details  Name: Kyle Parrish MRN: [id number] Date of Birth: 1912-08-23  Date of referral: 08/11/15   Reason for consult: Facility Placement     Permission sought to share information with: Family Supports Permission granted to share information:: Yes, Verbal Permission Granted Name:: Mardene Celeste Agency:: [facility] SNF Relationship:: dtr Contact Information:    Housing/Transportation Living arrangements for the past 2 months: Single Family Home Source of Information: Adult Children Patient Interpreter Needed: None Criminal Activity/Legal Involvement Pertinent to Current Situation/Hospitalization: No - Comment as needed Significant Relationships:  Adult Children Lives with: Self Do you feel safe going back to the place where you live? No Need for family participation in patient care: Yes (Comment) (decision making)  Care giving concerns: Pt lives at home alone- per dtr he is rarely alone at home and almost constantly has family there with him. Pt currently with increased weakness, however, and family unable to provide sufficient assistance at home.   Social Worker assessment / plan: CSW spoke with pt and pt dtr at bedside concerning PT recommendation for SNF. Pt dtr reports pt when to CIR last time he was admitted and has never been to SNF- CSW discussed SNF and SNF referral process.  Pt hard of hearing and did not understand all of conversation- dtr would repeat loudly that rehab was being discussed- pt prefers to go home but was not argumentative with recommendations and dtrs preference for rehab first.  Employment status: Retired Forensic scientist: Medicare PT Recommendations: [place] / Referral to community resources: [place]  Patient/Family's Response to care: Pt and dtr agreeable to short term SNF stay prior to pt return home.  Patient/Family's Understanding of and Emotional Response to Diagnosis, Current Treatment, and Prognosis: Pt dtr has good understanding of pt condition and is hopeful pt can return home after a very short SNF stay.  Emotional Assessment Appearance: Appears stated age Attitude/Demeanor/Rapport:   Affect (typically observed): Appropriate, Defensive Orientation: Oriented to Self, Oriented to Place Alcohol / Substance use: Not Applicable Psych involvement (Current and /or in the community): No (Comment)  Discharge Needs  Concerns to be addressed: Home Safety Concerns, Care Coordination Readmission within the last 30 days: No Current discharge risk: Physical Impairment Barriers  to Discharge: Continued Medical Work up   [employer], [place] 08/11/2015, 11:38 AM

## 2015-08-19 NOTE — Progress Notes (Signed)
Attending MD made aware of patient temp at 2306 and was directed to keep patient warm.

## 2015-08-20 DIAGNOSIS — L8990 Pressure ulcer of unspecified site, unspecified stage: Secondary | ICD-10-CM | POA: Insufficient documentation

## 2015-08-20 DIAGNOSIS — J690 Pneumonitis due to inhalation of food and vomit: Secondary | ICD-10-CM

## 2015-08-20 DIAGNOSIS — Z7189 Other specified counseling: Secondary | ICD-10-CM

## 2015-08-20 DIAGNOSIS — Z515 Encounter for palliative care: Secondary | ICD-10-CM

## 2015-08-20 DIAGNOSIS — R160 Hepatomegaly, not elsewhere classified: Secondary | ICD-10-CM

## 2015-08-20 LAB — BASIC METABOLIC PANEL
Anion gap: 13 (ref 5–15)
BUN: 108 mg/dL — AB (ref 6–20)
CALCIUM: 8.1 mg/dL — AB (ref 8.9–10.3)
CO2: 13 mmol/L — ABNORMAL LOW (ref 22–32)
CREATININE: 4.62 mg/dL — AB (ref 0.61–1.24)
Chloride: 110 mmol/L (ref 101–111)
GFR, EST AFRICAN AMERICAN: 11 mL/min — AB (ref >60)
GFR, EST NON AFRICAN AMERICAN: 9 mL/min — AB (ref >60)
Glucose, Bld: 107 mg/dL — ABNORMAL HIGH (ref 65–99)
Potassium: 4.8 mmol/L (ref 3.5–5.1)
SODIUM: 136 mmol/L (ref 135–145)

## 2015-08-20 LAB — GLUCOSE, CAPILLARY
GLUCOSE-CAPILLARY: 104 mg/dL — AB (ref 65–99)
Glucose-Capillary: 103 mg/dL — ABNORMAL HIGH (ref 65–99)
Glucose-Capillary: 99 mg/dL (ref 65–99)
Glucose-Capillary: 107 mg/dL — ABNORMAL HIGH (ref 65–99)
Glucose-Capillary: 105 mg/dL — ABNORMAL HIGH (ref 65–99)

## 2015-08-20 LAB — URINE CULTURE

## 2015-08-20 LAB — PROCALCITONIN: PROCALCITONIN: 46.56 ng/mL

## 2015-08-20 LAB — LEGIONELLA PNEUMOPHILA SEROGP 1 UR AG: L. pneumophila Serogp 1 Ur Ag: NEGATIVE

## 2015-08-20 LAB — VANCOMYCIN, RANDOM: VANCOMYCIN RM: 9 ug/mL

## 2015-08-20 LAB — UREA NITROGEN, URINE: Urea Nitrogen, Ur: 522 mg/dL

## 2015-08-20 MED ORDER — SODIUM CHLORIDE 0.9 % IV SOLN
INTRAVENOUS | Status: DC
  Administered 2015-08-20 – 2015-08-21 (×2): via INTRAVENOUS

## 2015-08-20 MED ORDER — VANCOMYCIN HCL IN DEXTROSE 1-5 GM/200ML-% IV SOLN
1000.0000 mg | Freq: Once | INTRAVENOUS | Status: AC
  Administered 2015-08-20: 1000 mg via INTRAVENOUS
  Filled 2015-08-20: qty 200

## 2015-08-20 NOTE — Progress Notes (Signed)
Pharmacy Antibiotic Note  Kyle Parrish is a 82 y.o. male admitted on 08/18/2015 with pneumonia.  He was hospitalized 5/27-->5/31 with hypoglycemia and discharged to a rehab facility. He presents today with AMS. Pharmacy has been consulted for Vancomycin & Zosyn dosing.  Plan:  Continue Zosyn 2.25g IV Q8H  Vancomycin 1250 mg IV x1 dose given on 6/5 at 1739  Measure Vanc random level on 6/7 at 1800 prior to further doses to assess clearance d/t renal   Follow up renal fxn, culture results, and clinical course.   Height: 5\' 7"  (170.2 cm) Weight: 140 lb 3.4 oz (63.6 kg) IBW/kg (Calculated) : 66.1  Temp (24hrs), Avg:95.4 F (35.2 C), Min:94.3 F (34.6 C), Max:97.7 F (36.5 C)   Recent Labs Lab 08/18/15 1605 08/18/15 1629 08/18/15 2102 08/19/15 0421 08/20/15 0533  WBC 9.8  --   --  9.6  --   CREATININE 4.94* 5.00*  --  4.52* 4.62*  LATICACIDVEN  --  4.06* 2.1*  --   --     Estimated Creatinine Clearance: 7.6 mL/min (by C-G formula based on Cr of 4.62).    No Known Allergies  Antimicrobials this admission: 6/5 Zosyn >>  6/5 Vanc >>   Dose adjustments this admission: 6/7 1800 Vanc random level = ___  Microbiology results: 6/5 BCx: ngtd 6/5 S pneumo antigen: negative 6/5 Legionella antigen: negative 6/5 Urine Cx: multiple species present 6/5 MRSA PCR: negative   Thank you for allowing pharmacy to be a part of this patient's care.  Gretta Arab PharmD, BCPS Pager [phone] 08/20/2015 1:56 PM

## 2015-08-20 NOTE — Progress Notes (Signed)
PROGRESS NOTE                                                                                                                                                                                                             Patient Demographics:    Kyle Parrish, is a 82 y.o. male, DOB - [birth year], [id number]  Admit date - 08/18/2015   Admitting Physician Kyle Bulls, MD  Outpatient Primary MD for the patient is Wyatt Haste, MD  LOS - 2  Chief Complaint  Patient presents with  . Weakness  . Altered Mental Status       Brief Narrative    Kyle Parrish is a 82 y.o. male with medical history significant for type 2 diabetes mellitus, hypertension, coronary artery disease with stent, colon cancer, hypothyroidism, and paroxysmal atrial fibrillation and presents to the ED from his SNF with 4 days of generalized weakness, altered mental status, and hypotension. Patient was recently admitted to the hospital and discharged to SNF one week ago following management of AKI, hypotension, and hypoglycemia. Unfortunately, patient's condition has continued to decline at the SNF and he has been hypotensive despite discontinuation of his blood pressure medicines last week. He had lived alone before the recent hospitalization and had been ambulatory and able to work with PT until this past week. He is reportedly been bedbound since being at the SNF. PT attempted to work with the patient today but his blood pressure dropped to [id number] systolic and he was directed to the emergency department for further evaluation.  In the ED workup consistent with sepsis with acute renal failure due to pneumonia possibly due to aspiration, he was also found to have acute renal failure on chronic kidney disease stage V. He is encephalopathic at this time, per daughter he is  DO NOT RESUSCITATE, with his underlying metastatic colon cancer his long-term  prognosis appears poor. We'll continue medical treatment and also involve palliative care for goals of care.    Subjective:    Ronnette Hila today has, No headache, No chest pain, No abdominal pain - No Nausea, No new weakness tingling or numbness, No Cough - SOB. Denies everything but is an unreliable historian, does not know where he is this AM.   Assessment  & Plan :    1.Sepsis with shock and acute renal failure  due to pneumonia, possible ongoing aspiration. Keep nothing by mouth, failed speech evaluation, continue IV fluids, continue empiric antibiotics which are IV vancomycin and Zosyn, currently DO NOT RESUSCITATE, long-term prognosis not good will involve palliative care; they have a meeting with daughter this morning at 11:30.  2. ARF on CK D stage V. Baseline creatinine close to 3. Hydrate and monitor. Renal function somewhat stable, check a baseline bladder scan.  3. Toxic encephalopathy due to #1 and 2. Supportive care as above and monitor. Head CT unremarkable. No focal deficits. Keep NPO for now until goals of care are clarified.  4. Paroxysmal atrial fibrillation. [place] vasc 2 score of 6. Not on chronic anticoagulation due to fall and bleeding risk, continue aspirin, Coreg held due to soft blood pressure from sepsis.  5. DM type II. For now on sliding scale. Has had issues with hypoglycemia recently.  CBG (last 3)   Recent Labs  08/20/15 0033 08/20/15 0405 08/20/15 0751  GLUCAP 104* 103* 99    6.Hypothyroidism. Continue Synthroid. If remains nothing by mouth for a long time we'll switch to IV.  7.BPH on Flomax.  8. Mildly elevated troponin due to demand ischemia from sepsis. Troponin trend flat. Continue aspirin, once blood pressure improves resume Coreg. No further workup.  9. A OCD. Stable.  10. Mild thrombocytopenia due to underlying cirrhosis.  11. CAD - no acute issues. Continue aspirin, once blood pressure stable resume beta blocker.  12. HX of colon  cancer, CT scan here reveals possible metastasis - called daughter on the listed work phone number to get more history but not available, will retry.    Code Status :  DO NOT RESUSCITATE  Family Communication  :  None present  Disposition Plan  :  SNF with possible palliative care  Consults  : Palliative care  Procedures  :   CT head. Nonacute  CT abdomen and pelvis. Multiple liver metastases, bilateral pleural effusions, gallstones  Chest x-ray. Bilateral infiltrates  DVT Prophylaxis  :   Heparin   Lab Results  Component Value Date   PLT 118* 08/19/2015    Inpatient Medications  Scheduled Meds: . aspirin  81 mg Oral Daily  . dorzolamide-timolol  1 drop Both Eyes BID  . folic acid  1 mg Oral Daily  . heparin  5,000 Units Subcutaneous Q8H  . insulin aspart  0-9 Units Subcutaneous Q6H  . latanoprost  1 drop Both Eyes QHS  . levothyroxine  88 mcg Oral QAC breakfast  . multivitamin with minerals  1 tablet Oral Daily  . piperacillin-tazobactam (ZOSYN)  IV  2.25 g Intravenous Q8H  . tamsulosin  0.4 mg Oral QPC breakfast   Continuous Infusions: . sodium chloride 75 mL/hr at 08/20/15 0314   PRN Meds:.  Antibiotics  :    Anti-infectives    Start     Dose/Rate Route Frequency Ordered Stop   08/18/15 2359  piperacillin-tazobactam (ZOSYN) IVPB 2.25 g     2.25 g 100 mL/hr over 30 Minutes Intravenous Every 8 hours 08/18/15 1905     08/18/15 1545  vancomycin (VANCOCIN) 1,250 mg in sodium chloride 0.9 % 250 mL IVPB     1,250 mg 166.7 mL/hr over 90 Minutes Intravenous  Once 08/18/15 1540 08/18/15 1909   08/18/15 1545  piperacillin-tazobactam (ZOSYN) IVPB 3.375 g     3.375 g 100 mL/hr over 30 Minutes Intravenous  Once 08/18/15 1540 08/18/15 1739         Objective:   [employer]  Vitals:   08/19/15 2043 08/20/15 0413 08/20/15 0640 08/20/15 0702  BP:  92/47    Pulse:  60    Temp: 95.3 F (35.2 C)  94.3 F (34.6 C) 95 F (35 C)  TempSrc: Rectal  Rectal Rectal  Resp:  18     Height:      Weight:      SpO2:  99%      Wt Readings from Last 3 Encounters:  08/18/15 63.6 kg (140 lb 3.4 oz)  08/15/15 58.514 kg (129 lb)  08/13/15 61.281 kg (135 lb 1.6 oz)     Intake/Output Summary (Last 24 hours) at 08/20/15 0848 Last data filed at 08/20/15 0630  Gross per 24 hour  Intake 1618.75 ml  Output    175 ml  Net 1443.75 ml     Physical Exam  Awake,But extremely confused, No new F.N deficits, Normal affect [place].AT,PERRAL Supple Neck,No JVD, No cervical lymphadenopathy appriciated.  Symmetrical Chest wall movement, Good air movement bilaterally, CTAB RRR,No Gallops,Rubs or new Murmurs, No Parasternal Heave +ve B.Sounds, Abd Soft, No tenderness, No organomegaly appriciated, No rebound - guarding or rigidity. Condom catheter in place No Cyanosis, Clubbing or edema, No new Rash or bruise       Data Review:    CBC  Recent Labs Lab 08/18/15 1605 08/18/15 1629 08/19/15 0421  WBC 9.8  --  9.6  HGB 10.3* 10.9* 10.3*  HCT 29.6* 32.0* 29.1*  PLT 138*  --  118*  MCV 89.7  --  90.4  MCH 31.2  --  32.0  MCHC 34.8  --  35.4  RDW 19.6*  --  20.1*  LYMPHSABS 1.2  --  0.9  MONOABS 0.7  --  0.5  EOSABS 0.1  --  0.1  BASOSABS 0.0  --  0.0    Chemistries   Recent Labs Lab 08/18/15 1605 08/18/15 1629 08/19/15 0421 08/20/15 0533  NA 135 135 135 136  K 4.3 4.3 4.7 4.8  CL 106 105 109 110  CO2 15*  --  13* 13*  GLUCOSE 96 87 131* 107*  BUN 105* 100* 110* 108*  CREATININE 4.94* 5.00* 4.52* 4.62*  CALCIUM 8.4*  --  7.9* 8.1*  AST 121*  --  143*  --   ALT 55  --  59  --   ALKPHOS 345*  --  304*  --   BILITOT 1.3*  --  1.6*  --    ------------------------------------------------------------------------------------------------------------------ No results for input(s): CHOL, HDL, LDLCALC, TRIG, CHOLHDL, LDLDIRECT in the last 72 hours.  Lab Results  Component Value Date   HGBA1C 8.5* 08/10/2015    ------------------------------------------------------------------------------------------------------------------ No results for input(s): TSH, T4TOTAL, T3FREE, THYROIDAB in the last 72 hours.  Invalid input(s): FREET3 ------------------------------------------------------------------------------------------------------------------ No results for input(s): VITAMINB12, FOLATE, FERRITIN, TIBC, IRON, RETICCTPCT in the last 72 hours.  Coagulation profile No results for input(s): INR, PROTIME in the last 168 hours.  No results for input(s): DDIMER in the last 72 hours.  Cardiac Enzymes  Recent Labs Lab 08/18/15 2102 08/19/15 0344 08/19/15 0919  TROPONINI 0.10* 0.10* 0.09*   ------------------------------------------------------------------------------------------------------------------ No results found for: BNP  Micro Results Recent Results (from the past 240 hour(s))  Urine culture     Status: Abnormal   Collection Time: 08/10/15  9:50 AM  Result Value Ref Range Status   Specimen Description URINE, CLEAN CATCH  Final   Special Requests NONE  Final   Culture MULTIPLE SPECIES PRESENT, SUGGEST RECOLLECTION (A)  Final  Report Status 08/11/2015 FINAL  Final  Blood culture (routine x 2)     Status: None (Preliminary result)   Collection Time: 08/18/15  4:09 PM  Result Value Ref Range Status   Specimen Description BLOOD LEFT ANTECUBITAL  Final   Special Requests BOTTLES DRAWN AEROBIC AND ANAEROBIC 5CC  Final   Culture   Final    NO GROWTH < 24 HOURS Performed at [facility]    Report Status PENDING  Incomplete  Blood culture (routine x 2)     Status: None (Preliminary result)   Collection Time: 08/18/15  4:14 PM  Result Value Ref Range Status   Specimen Description BLOOD LEFT ARM  Final   Special Requests BOTTLES DRAWN AEROBIC AND ANAEROBIC 5CC  Final   Culture   Final    NO GROWTH < 24 HOURS Performed at [facility]    Report Status PENDING  Incomplete   MRSA PCR Screening     Status: None   Collection Time: 08/18/15  8:46 PM  Result Value Ref Range Status   MRSA by PCR NEGATIVE NEGATIVE Final    Comment:        The GeneXpert MRSA Assay (FDA approved for NASAL specimens only), is one component of a comprehensive MRSA colonization surveillance program. It is not intended to diagnose MRSA infection nor to guide or monitor treatment for MRSA infections.     Radiology Reports Ct Abdomen Pelvis Wo Contrast  08/18/2015  CLINICAL DATA:  Weakness. Altered mental status. Hypotension. History of colon cancer and colectomy. EXAM: CT ABDOMEN AND PELVIS WITHOUT CONTRAST TECHNIQUE: Multidetector CT imaging of the abdomen and pelvis was performed following the standard protocol without IV contrast. COMPARISON:  Abdomen ultrasound dated 08/12/2015. FINDINGS: Lower chest: Moderate size bilateral pleural effusions. Bilateral lower lobe atelectasis. Hepatobiliary: Large number of masses throughout the liver. Large number of small gallstones in the gallbladder, all measuring less than 5 mm in diameter each. Some contain nitrogen. No gallbladder wall thickening or pericholecystic fluid. Pancreas: Diffuse atrophy of the neck and body of the pancreas. The head and tail are normal in caliber. No visible mass or stone. Spleen: Scattered tiny calcified granulomata or vascular calcifications. Normal in size. Adrenals/Urinary Tract: Normal appearing adrenal glands, kidneys, ureters and urinary bladder. No calculi or hydronephrosis. No visible mass. Stomach/Bowel: Surgically absent right colon and hepatic flexure with a small bowel to colonic anastomosis adjacent to the gallbladder there is also a probable gas-filled diverticulum adjacent to the gallbladder. No definite gas within the gallbladder. Vascular/Lymphatic: Extensive atheromatous arterial calcifications. These include dense coronary artery calcifications. No enlarged lymph nodes. Reproductive: Moderately enlarged  prostate gland. Other: None. Musculoskeletal: Extensive lumbar and lower thoracic spine degenerative changes. IMPRESSION: 1. Large number of metastases throughout the liver. 2. Moderate-sized bilateral pleural effusions. 3. Bilateral lower lobe atelectasis. 4. Cholelithiasis. 5. Sigmoid diverticulosis. 6. Surgical absence of the right colon and hepatic flexure. 7. Dense atheromatous coronary artery calcifications. 8. Moderately enlarged prostate gland. Electronically Signed   By: Claudie Revering M.D.   On: 08/18/2015 17:50   Dg Chest 2 View  08/18/2015  CLINICAL DATA:  Weakness.  Hypotension.  Altered mental status. EXAM: CHEST  2 VIEW COMPARISON:  08/10/2015. FINDINGS: Poor inspiration. Normal sized heart with an interval decrease in size. The moderate sized bilateral pleural effusions, increased. Interval patchy opacity at the right lung base and linear and mild patchy opacity at the left lung base. No acute bony abnormality. IMPRESSION: 1. Interval pneumonia or patchy  atelectasis of the right lung base. 2. Left basilar atelectasis and possible pneumonia. 3. Moderate-sized bilateral pleural effusions, increased. Electronically Signed   By: Claudie Revering M.D.   On: 08/18/2015 16:32    Ct Head Wo Contrast  08/18/2015  CLINICAL DATA:  Altered mental status EXAM: CT HEAD WITHOUT CONTRAST TECHNIQUE: Contiguous axial images were obtained from the base of the skull through the vertex without intravenous contrast. COMPARISON:  None. FINDINGS: The bony calvarium is intact. No gross soft tissue abnormality is noted. Mild atrophic changes are identified stable from the prior exam. No findings to suggest acute hemorrhage, acute infarction or space-occupying mass lesion are noted. IMPRESSION: Chronic atrophic changes without acute abnormality. Electronically Signed   By: Inez Catalina M.D.   On: 08/18/2015 17:32      Time Spent in minutes  22   Coy Rochford Marry Guan M.D on 08/20/2015 at 8:48 AM  Between 7am to 7pm -  Pager - [phone]  After 7pm go to [email or web address] - password [facility]  [facility] -  Office  [phone]

## 2015-08-20 NOTE — Progress Notes (Signed)
Pharmacy Antibiotic Note  Kyle Parrish is a 82 y.o. male admitted on 08/18/2015 with pneumonia.  He was hospitalized 5/27-->5/31 with hypoglycemia and discharged to a rehab facility. He presents today with AMS. Pharmacy has been consulted for Vancomycin & Zosyn dosing.  Random vancomycin level collected at 1751 this evening = 9.0 (after 1 dose Vanc 1250mg  on 6/5 ~1730)  Plan: - Provide a dose of Vancomycin 1g IV x 1 tonight. - Continue Zosyn 2.25g IV Q8H - Blood cultures show no growth x 2 days and MRSA screen negative. Recommend discontinuing vancomycin.  Height: 5\' 7"  (170.2 cm) Weight: 140 lb 3.4 oz (63.6 kg) IBW/kg (Calculated) : 66.1  Temp (24hrs), Avg:95.4 F (35.2 C), Min:94.3 F (34.6 C), Max:97.7 F (36.5 C)   Recent Labs Lab 08/18/15 1605 08/18/15 1629 08/18/15 2102 08/19/15 0421 08/20/15 0533 08/20/15 1751  WBC 9.8  --   --  9.6  --   --   CREATININE 4.94* 5.00*  --  4.52* 4.62*  --   LATICACIDVEN  --  4.06* 2.1*  --   --   --   VANCORANDOM  --   --   --   --   --  9    Estimated Creatinine Clearance: 7.6 mL/min (by C-G formula based on Cr of 4.62).    No Known Allergies  Antimicrobials this admission: 6/5 Zosyn >>  6/5 Vanc >>   Dose adjustments this admission: 6/7 1800 Vanc random level = 9 mcg/ml  Microbiology results: 6/5 BCx: ngtd 6/5 S pneumo antigen: negative 6/5 Legionella antigen: negative 6/5 Urine Cx: multiple species present 6/5 MRSA PCR: negative   Thank you for allowing pharmacy to be a part of this patient's care.  Hershal Coria, PharmD, BCPS Pager: [phone] 08/20/2015 7:10 PM

## 2015-08-20 NOTE — Progress Notes (Signed)
Initial Nutrition Assessment  DOCUMENTATION CODES:   Severe malnutrition in context of chronic illness  INTERVENTION:  -Diet advancement per MD -RD to continue to monitor -Palliative Care following  NUTRITION DIAGNOSIS:   Malnutrition related to chronic illness as evidenced by severe depletion of muscle mass, severe depletion of body fat. GOAL:   Patient will meet greater than or equal to 90% of their needs  MONITOR:   PO intake, Supplement acceptance, Labs, I & O's, Weight trends, Skin  REASON FOR ASSESSMENT:   Low Braden    ASSESSMENT:   Kyle Parrish is a 82 y.o. male with medical history significant for type 2 diabetes mellitus, hypertension, coronary artery disease with stent, colon cancer, hypothyroidism, and paroxysmal atrial fibrillation and presents to the ED from his SNF with 4 days of generalized weakness, altered mental status, and hypotension. Patient was recently admitted to the hospital and discharged to SNF one week ago following management of AKI, hypotension, and hypoglycemia  Attempted to speak with [name] at bedside but he is unarousable, no family present. Pt identified as low braden with no indiciation of poor oral intake or weight loss. Currently, his weight is up 11# but he has consistently fluctuated between 129-144 for the past 6 months. Prior to that he got as high as 157# He currently has multiple pressure ulcers, which, according to MD note developed while he was admitted to rehab facility.  Nutrition-Focused physical exam completed. Findings are severe fat depletion, severe muscle depletion, and no edema.   Palliative is following, patient will d/c to rehab facility again if he improves clinically from current treatment course. He is at high risk for aspiration with his poor mentation, currently has no diet order. Patient is a poor candidate for nutrition support.  Will continue to monitor for goals of care.  Diet Order:    None  Skin:   2 Stg I's and 2 Stg II's  Last BM:  6/6  Height:   Ht Readings from Last 1 Encounters:  08/18/15 5\' 7"  (1.702 m)    Weight:   Wt Readings from Last 1 Encounters:  08/18/15 140 lb 3.4 oz (63.6 kg)    Ideal Body Weight:  67.72 kg  BMI:  Body mass index is 21.96 kg/(m^2).  Estimated Nutritional Needs:   Kcal:  1500-1700  Protein:  65-80 grams  Fluid:  >/= 1.5L  EDUCATION NEEDS:   No education needs identified at this time  Satira Anis. Tyla Burgner, MS, RD LDN Inpatient Clinical Dietitian Pager [phone]

## 2015-08-20 NOTE — Consult Note (Signed)
Consultation Note Date: 08/20/2015   Patient Name: Kyle Parrish  DOB: 1912-09-17  MRN: [id number]  Age / Sex: 82 y.o., male  PCP: Denita Lung, MD Referring Physician: Mir Marry Guan,*  Reason for Consultation: Establishing goals of care  HPI/Patient Profile: 82 y.o. male  with past medical history of type 2 diabetes mellitus, hypertension, coronary artery disease with stent, colon cancer, hypothyroidism, and paroxysmal atrial fibrillation admitted on 08/18/2015 with Weakness, altered mental status, and hypotension related to aspiration pneumonia. Additionally he has been found to have multiple liver lesions consistent with recurrence/metastasis of his colon cancer.  He is currently high risk for recurrent aspiration and is nothing by mouth. Palliative consulted for goals of care.   Clinical Assessment and Goals of Care: I met today with Kyle Parrish and his daughter.  He remains confused and is very hard of hearing. 83 of conversation was had in conjunction with his daughter who is been helping him make his medical decisions.  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 He has been living independently up until a couple of months ago. The most important things to him are his family and his independence.  She reports having a fairly good understanding of his condition we reviewed his clinical course to this point. This included discussion of finding consistent with metastatic disease in his liver likely from his prior colon cancer as well as development of pneumonia likely related to aspiration. We talked about his functional decline over the past several months.  She reports that she does not want to prolong his life or cause suffering if he is not going to recover from this event. She does not want to escalate care in  the event his condition continues to worsen.  She is in agreement the best plan moving forward is as outlined below with plan to continue current therapy, work to allow him to have oral nutrition and hydration despite risk for aspiration, and reevaluate his situation and 24 hours.  I discussed this with his bedside nurse, speech-language pathology, and primary attending.  SUMMARY OF RECOMMENDATIONS   - DO NOT RESUSCITATE/DO NOT INTUBATE. - Continue current care including antibiotics to gauge clinical response over next 24-48 hours. If he were to decompensate, focus will become on comfort. - Discussed risk of recurrent aspiration at length with his daughter. She expresses understanding his situation. At the same time, she is in agreement with plan to work in conjunction with speech language in order to determine safest diet for restarting him on oral nutrition and hydration. She understands that we are not going to be change the fact that he is going to aspirate at some point in the future. If it occurs in the short-term, we discussed plan to transition his care to focus on comfort.   - We discussed the likely pathways moving forward including continued improvement and trial of rehabilitation again at [facility] place with support of palliative care  following him as an outpatient, acute decompensation and focus on comfort with likely transition to residential hospice for end-of-life care, and transitioning from the hospital to home or skilled facility with hospice support. Unfortunately, the family would have to pay for home caregivers or custodial care if he were to elect hospice at home or hospice at long-term care facility and this does not seem to be a realistic option with his limited finances. - Plan for follow-up meeting to reassess his clinical situation tomorrow at 11:30 AM.  Code Status/Advance Care Planning:  DNR   Symptom Management:   Confusion: He appears to still be having some periods of  confusion. Daughter reports that this is improved overall. Continue to treat underlying infection.  Aspiration: Discussed at length with his daughter that this is something that is going to recur regardless of interventions. She is in agreement with plan to begin working to reestablish oral intake despite this risk. I spoke with SLP and appreciate their assistance in working to develop safest plan for feeding with understanding that we are not going to eliminate his risk for aspiration and it will recur at some point in time.  Palliative Prophylaxis:   Aspiration, Delirium Protocol and Frequent Pain Assessment  Additional Recommendations (Limitations, Scope, Preferences):  Continue current therapy. No escalation of care in event of acute decompensation  Psycho-social/Spiritual:   Desire for further Chaplaincy support: Did not address today  Additional Recommendations: Caregiving  Support/Resources  Prognosis:   Unable to determine due to acute illness. If he acutely aspirates, I would anticipate his time course to be a matter of days to weeks. Otherwise, I think with his advanced age; demonstrated decline in nutrition, functional status, and cognition; and 2nd hospitalization since April, it is likely that his expected prognosis will be less than 6 months and he should qualify for hospice support if so desired in any point in the future.  Discharge Planning: To Be Determined      Primary Diagnoses: Present on Admission:  . Aspiration pneumonia ([place]) . Coronary artery disease, with PCI of the LAD; following an anterior MI treated medically. . Hypothyroid . Type 2 diabetes with complication ([place]) . Elevated troponin . Acute renal failure superimposed on stage 3 chronic kidney disease ([place]) . Cirrhosis of liver ([place]) . Thrombocytopenia ([place]) . Normocytic anemia . Hypotension . Paroxysmal atrial fibrillation ([facility])  I have reviewed the medical record, interviewed the patient and  family, and examined the patient. The following aspects are pertinent.  Past Medical History  Diagnosis Date  . NIDDM (non-insulin dependent diabetes mellitus)   . Nephropathy   . History of ST elevation myocardial infarction (STEMI) of anterior wall June 2003    Anterior MI in the setting of GI bleed; initially treated medically; 10 weeks later underwent cardiac catheterization PCI the LAD.  Marland Kitchen CAD S/P percutaneous coronary angioplasty July 2003    PCI of the LAD - HepaCoat BMS 2.5 mm x 13 mm (3.7 mm).  . History of Ischemic cardiomyopathy Summer of 2003 - 2010    2003: Initial EF Post PCI was 40%; by Myoview in 2005 EF is 51% but no ischemia.;  No recurrence of heart failure or following PCI in 2003;; Echo 03/2008: Normal LV size & function, Gr 1 DD with elevated LAP, Mild Ao sclerosis  . Obesity   . Actinic keratoses   . Colon cancer ([place])   . Dyslipidemia   . Gout   . Hypothyroid   . Anemia   .  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 Disc herniation   . Hard of hearing      very hard of hearing   Social History   Social History  . Marital Status: Widowed    Spouse Name: N/A  . Number of Children: N/A  . Years of Education: N/A   Social History Main Topics  . Smoking status: Current Every Day Smoker -- 2.00 packs/day    Types: Cigars  . Smokeless tobacco: Never Used     Comment: 2-3 cigars a day  . Alcohol Use: No  . Drug Use: No  . Sexual Activity: No   Other Topics Concern  . None   Social History Narrative   He is a widowed gentleman with one child and 2 grandchildren.  One of his granddaughters actually lives with him.  Her husband is driving in today.   He usually gets his own meals.  He does sleep quite late into the late morning.  He takes catnaps in the afternoon and stays up till midnight.   He persistently smokes 2 cigars a day and has no intention of quitting.   He used to take a lot of beer but has not drunk for several years.   He likes to  travel his family and likes to go out on walks support his weight with his son-in-law.   Family History  Problem Relation Age of Onset  . Family history unknown: Yes   Scheduled Meds: . aspirin  81 mg Oral Daily  . dorzolamide-timolol  1 drop Both Eyes BID  . folic acid  1 mg Oral Daily  . heparin  5,000 Units Subcutaneous Q8H  . insulin aspart  0-9 Units Subcutaneous Q6H  . latanoprost  1 drop Both Eyes QHS  . levothyroxine  88 mcg Oral QAC breakfast  . multivitamin with minerals  1 tablet Oral Daily  . piperacillin-tazobactam (ZOSYN)  IV  2.25 g Intravenous Q8H  . tamsulosin  0.4 mg Oral QPC breakfast   Continuous Infusions:  PRN Meds:. Medications Prior to Admission:  Prior to Admission medications   Medication Sig Start Date End Date Taking? Authorizing Provider  aspirin 81 MG tablet Take 81 mg by mouth daily.     Yes Historical Provider, MD  carvedilol (COREG) 6.25 MG tablet TAKE ONE TABLET BY MOUTH ONCE DAILY Patient taking differently: TAKE 6.25 MG BY MOUTH ONCE DAILY 09/10/13  Yes Denita Lung, MD  Dorzolamide HCl-Timolol Mal PF 22.3-6.8 MG/ML SOLN Apply 1 drop to eye 2 (two) times daily.   Yes Historical Provider, MD  folic acid (FOLVITE) 1 MG tablet Take 1 tablet (1 mg total) by mouth daily. 02/04/15  Yes Denita Lung, MD  insulin aspart (NOVOLOG) 100 UNIT/ML injection Inject 0-9 Units into the skin 3 (three) times daily with meals. 08/13/15  Yes Nishant Dhungel, MD  latanoprost (XALATAN) 0.005 % ophthalmic solution Place 1 drop into both eyes at bedtime.   Yes Historical Provider, MD  levothyroxine (SYNTHROID, LEVOTHROID) 88 MCG tablet TAKE ONE TABLET BY MOUTH ONCE DAILY Patient taking differently: TAKE 88 MCG BY MOUTH ONCE DAILY 08/06/15  Yes Denita Lung, MD  Multiple Vitamins-Minerals (MENS MULTIVITAMIN PLUS PO) Take 1 tablet by mouth daily.    Yes Historical Provider, MD  sodium bicarbonate 650 MG tablet Take 1 tablet (650 mg total) by mouth daily. 08/12/15  Yes  Nita Sells, MD  tamsulosin (FLOMAX) 0.4 MG CAPS capsule TAKE ONE CAPSULE BY  MOUTH ONCE DAILY. Patient taking differently: TAKE 0.4 MG BY MOUTH ONCE DAILY. 07/11/15  Yes Denita Lung, MD  UNABLE TO FIND Med Name: Med pass 120 mL 2 times daily between meals   Yes Historical Provider, MD   No Known Allergies Review of Systems  Unable to perform ROS  Physical Exam  General: Elderly male lying in bed. Sleepy but arouses easily, in no acute distress. Seems somewhat confused and difficult to understand. HEENT: No bruits, no goiter, no JVD Heart: Regular rate and rhythm. No murmur appreciated. Lungs: Fair air movement, scattered rales in bases Abdomen: Soft, nontender, nondistended, positive bowel sounds.  Ext: No significant edema Skin: Warm and dry Neuro: Grossly intact, nonfocal.  Vital Signs: BP 92/47 mmHg  Pulse 60  Temp(Src) 95 F (35 C) (Rectal)  Resp 18  Ht '5\' 7"'$  (1.702 m)  Wt 63.6 kg (140 lb 3.4 oz)  BMI 21.96 kg/m2  SpO2 99% Pain Assessment: No/denies pain   Pain Score: 0-No pain   SpO2: SpO2: 99 % O2 Device:SpO2: 99 % O2 Flow Rate: .O2 Flow Rate (L/min): 1.5 L/min  IO: Intake/output summary:  Intake/Output Summary (Last 24 hours) at 08/20/15 1302 Last data filed at 08/20/15 0843  Gross per 24 hour  Intake 1618.75 ml  Output    175 ml  Net 1443.75 ml    LBM: Last BM Date: 08/19/15 Baseline Weight: Weight: 61.054 kg (134 lb 9.6 oz) Most recent weight: Weight: 63.6 kg (140 lb 3.4 oz)     Palliative Assessment/Data: PPS 30%   Flowsheet Rows        Most Recent Value   Intake Tab    Referral Department  Hospitalist   Unit at Time of Referral  Cardiac/Telemetry Unit   Palliative Care Primary Diagnosis  Sepsis/Infectious Disease   Date Notified  08/19/15   Palliative Care Type  New Palliative care   Reason for referral  Clarify Goals of Care   Date of Admission  08/18/15   Date first seen by Palliative Care  08/19/15   # of days Palliative  referral response time  0 Day(s)   # of days IP prior to Palliative referral  1   Clinical Assessment    Palliative Performance Scale Score  30%   Pain Max last 24 hours  Not able to report   Pain Min Last 24 hours  Not able to report   Psychosocial & Spiritual Assessment    Palliative Care Outcomes    Patient/Family meeting held?  Yes   Who was at the meeting?  daughter   Palliative Care Outcomes  Clarified goals of care, Counseled regarding hospice      Time In: 1130 Time Out: 1255 Time Total: 85 Greater than 50%  of this time was spent counseling and coordinating care related to the above assessment and plan.  Signed by: Micheline Rough, MD   Please contact Palliative Medicine Team phone at [phone] for questions and concerns.  For individual provider: See Shea Evans

## 2015-08-20 NOTE — Progress Notes (Signed)
Speech Language Pathology Treatment: Dysphagia  Patient Details Name: Kyle Parrish MRN: [place]:[id number] DOB: [birth year] Today's Date: 08/20/2015 Time: 1400-1409 SLP Time Calculation (min) (ACUTE ONLY): 9 min  Assessment / Plan / Recommendation Clinical Impression  Pt continues to be grossly weak/dysarthric but willing to accept minimal po with SLP.   Per RN, pt was asking for a drink earlier - he is now hypotensive.  Pt accepted single ice chip bolus only with delayed swallow response but no indication of airway compromise.  He did not want further intake - shaking his head "no" to SLP offering soda, water, ice cream, etc.   Pt did state "Oh God" but remainder of verbal output was dysarthric.  SLP offered to call for chaplain but pt declined.    Currently, intake does not appear comforting or desired by this pt.  Given premorbid soft/nectar diet - dysphagia present even prior to admission.  To honor pt's wishes and for his maximal comfort, would recommend only providing ice chips and small amounts of liquids via tsp when/if pt desires.  Pt became more lethargic as session continued and he closed his eyes.     If pt becomes more alert and desires po intake, anticipate tsp amounts of liquids to be more efficient/comforting for him given level of dysarthria.  Pt's dysphagia, mental status and weakness prohibit him from meeting nutritional/hydration needs.    HPI HPI: 82 yo male adm to [facility] with 4 days of weakness.  Pt diagnosed with aspiration pna.  PMH + for dysphagia with diet being soft/nectar at facility, CT abdomen with liver mets and moderate pleural effusion.  Pt with poor prognosis per notes and palliative referral pending.  Swallow evaluation.        SLP Plan  Continue with current plan of care     Recommendations  Diet recommendations: NPO (tsps thin water, ice when/if desired) Liquids provided via: Teaspoon Medication Administration: Via alternative means Supervision: Full  supervision/cueing for compensatory strategies Compensations: Minimize environmental distractions (delayed swallow, observe pt to swallow before giving more) Postural Changes and/or Swallow Maneuvers: Seated upright 90 degrees             Oral Care Recommendations: Oral care QID Follow up Recommendations: None Plan: Continue with current plan of care     [place], [place] [facility] SLP [phone]

## 2015-08-20 NOTE — Progress Notes (Signed)
BP 80/43 at 1400.  Retaken 1 hour later and still low at 83/40.  MD notified, will continue to monitor.

## 2015-08-21 DIAGNOSIS — N179 Acute kidney failure, unspecified: Secondary | ICD-10-CM

## 2015-08-21 DIAGNOSIS — N183 Chronic kidney disease, stage 3 (moderate): Secondary | ICD-10-CM

## 2015-08-21 LAB — BASIC METABOLIC PANEL
Anion gap: 14 (ref 5–15)
BUN: 109 mg/dL — AB (ref 6–20)
CALCIUM: 8.1 mg/dL — AB (ref 8.9–10.3)
CO2: 12 mmol/L — ABNORMAL LOW (ref 22–32)
CREATININE: 5.1 mg/dL — AB (ref 0.61–1.24)
Chloride: 111 mmol/L (ref 101–111)
GFR calc Af Amer: 10 mL/min — ABNORMAL LOW (ref >60)
GFR, EST NON AFRICAN AMERICAN: 8 mL/min — AB (ref >60)
GLUCOSE: 132 mg/dL — AB (ref 65–99)
POTASSIUM: 5.1 mmol/L (ref 3.5–5.1)
SODIUM: 137 mmol/L (ref 135–145)

## 2015-08-21 LAB — GLUCOSE, CAPILLARY
GLUCOSE-CAPILLARY: 117 mg/dL — AB (ref 65–99)
GLUCOSE-CAPILLARY: 123 mg/dL — AB (ref 65–99)

## 2015-08-21 MED ORDER — OXYCODONE HCL 20 MG/ML PO CONC: 5.0000 mg | ORAL | Status: DC | PRN

## 2015-08-21 MED ORDER — GLYCOPYRROLATE 1 MG PO TABS
1.0000 mg | ORAL_TABLET | ORAL | Status: DC | PRN
  Filled 2015-08-21: qty 1

## 2015-08-21 MED ORDER — BISACODYL 10 MG RE SUPP: 10.0000 mg | Freq: Every day | RECTAL | Status: DC | PRN

## 2015-08-21 MED ORDER — GLYCOPYRROLATE 0.2 MG/ML IJ SOLN
0.2000 mg | INTRAMUSCULAR | Status: DC | PRN
  Filled 2015-08-21: qty 1

## 2015-08-21 MED ORDER — HYDROMORPHONE HCL 1 MG/ML IJ SOLN: 0.3000 mg | Freq: Two times a day (BID) | INTRAMUSCULAR | Status: DC | PRN

## 2015-08-21 MED ORDER — LORAZEPAM 1 MG PO TABS: 1.0000 mg | ORAL_TABLET | ORAL | Status: DC | PRN

## 2015-08-21 MED ORDER — ACETAMINOPHEN 650 MG RE SUPP: 650.0000 mg | Freq: Four times a day (QID) | RECTAL | Status: DC | PRN

## 2015-08-21 MED ORDER — HALOPERIDOL LACTATE 2 MG/ML PO CONC
0.5000 mg | ORAL | Status: DC | PRN
  Filled 2015-08-21: qty 0.3

## 2015-08-21 MED ORDER — ONDANSETRON HCL 4 MG/2ML IJ SOLN: 4.0000 mg | Freq: Four times a day (QID) | INTRAMUSCULAR | Status: DC | PRN

## 2015-08-21 MED ORDER — ACETAMINOPHEN 325 MG PO TABS: 650.0000 mg | ORAL_TABLET | Freq: Four times a day (QID) | ORAL | Status: DC | PRN

## 2015-08-21 MED ORDER — LORAZEPAM 2 MG/ML IJ SOLN: 1.0000 mg | INTRAMUSCULAR | Status: DC | PRN

## 2015-08-21 MED ORDER — HALOPERIDOL 0.5 MG PO TABS
0.5000 mg | ORAL_TABLET | ORAL | Status: DC | PRN
  Filled 2015-08-21: qty 1

## 2015-08-21 MED ORDER — ONDANSETRON 4 MG PO TBDP: 4.0000 mg | ORAL_TABLET | Freq: Four times a day (QID) | ORAL | Status: DC | PRN

## 2015-08-21 MED ORDER — HALOPERIDOL LACTATE 5 MG/ML IJ SOLN: 0.5000 mg | INTRAMUSCULAR | Status: DC | PRN

## 2015-08-21 MED ORDER — LORAZEPAM 2 MG/ML PO CONC: 1.0000 mg | ORAL | Status: DC | PRN

## 2015-08-21 NOTE — Progress Notes (Signed)
Unable to place foley, meeting resistance and uncomfortable for patient, condom cath placed. Report given to [place] at [place] [facility].

## 2015-08-21 NOTE — Progress Notes (Signed)
Patient discharged to [facility] place via ambulance, discharge packet prepared by CSW and given to ambulance for facility. Patient still lethargic, no distress noted.

## 2015-08-21 NOTE — Discharge Summary (Addendum)
Discharge Summary  Kyle Parrish QKM:[id number] DOB: 27-Feb-1913  PCP: Wyatt Haste, MD  Admit date: 08/18/2015 Discharge date: 08/21/2015  Recommendations for Outpatient Follow-up:  1. None   Discharge Diagnoses:  Active Hospital Problems   Diagnosis Date Noted  . Aspiration pneumonia ([place]) 08/18/2015  . Pressure ulcer (cannot specify further) 08/20/2015  . Normocytic anemia 08/18/2015  . Hypotension 08/18/2015  . Paroxysmal atrial fibrillation ([place]) 08/18/2015  . Acute renal failure superimposed on stage 3 chronic kidney disease ([place]) 08/13/2015  . Cirrhosis of liver ([place]) 08/13/2015  . Thrombocytopenia ([place]) 08/13/2015  . Elevated troponin 08/09/2015  . Hypothyroid 08/03/2010  . Type 2 diabetes with complication ([place]) 57/90/3833  . Coronary artery disease, with PCI of the LAD; following an anterior MI treated medically. 09/02/2001    Resolved Hospital Problems   Diagnosis Date Noted Date Resolved  No resolved problems to display.    Discharge Condition: Stable   Diet recommendation: As tolerated   Filed Vitals:   08/21/15 0500 08/21/15 1331  BP: 80/45 80/45  Pulse: 111 52  Temp: 92.9 F (33.8 C)   Resp: 29 20    Kyle Parrish is a 82 y.o. male with medical history significant for type 2 diabetes mellitus, hypertension, coronary artery disease with stent, colon cancer, hypothyroidism, and paroxysmal atrial fibrillation and presents to the ED from his SNF with 4 days of generalized weakness, altered mental status, and hypotension. Patient was recently admitted to the hospital and discharged to SNF one week ago following management of AKI, hypotension, and hypoglycemia. Unfortunately, patient's condition has continued to decline at the SNF and he has been hypotensive despite discontinuation of his blood pressure medicines last week. He had lived alone before the recent hospitalization and had been ambulatory and able to work with PT until this past week. He  is reportedly been bedbound since being at the SNF. PT attempted to work with the patient today but his blood pressure dropped to 38V systolic and he was directed to the emergency department for further evaluation.  In the ED workup consistent with sepsis with acute renal failure due to pneumonia possibly due to aspiration, he was also found to have acute renal failure on chronic kidney disease stage V. He is encephalopathic at this time, per daughter he is DO NOT RESUSCITATE, with his underlying metastatic colon cancer his long-term prognosis appears poor. We'll continue medical treatment and also involve palliative care for goals of care.       1.Sepsis with shock and acute renal failure due to pneumonia, possible ongoing aspiration. Keep nothing by mouth, failed speech evaluation, continue IV fluids, continue empiric antibiotics which are IV vancomycin and Zosyn, currently DO NOT RESUSCITATE, long-term prognosis not good.  Palliative care met with daughter twice, she has now requested comfort care and patient will be discharged to residential hospice today.  2. ARF on CK D stage V. Baseline creatinine close to 3. Hydrate and monitor. Renal function worsening.  3. Toxic encephalopathy due to #1 and 2. Supportive care as above and monitor. Head CT unremarkable. No focal deficits. Was kept NPO until goals of care are clarified, now he is refusing to take anything by mouth.  4. Paroxysmal atrial fibrillation. [place] vasc 2 score of 6. Not on chronic anticoagulation due to fall and bleeding risk, continue aspirin, Coreg held due to soft blood pressure from sepsis.  5. DM type II. For now on sliding scale. Has had issues with hypoglycemia recently.  Consultations:  Palliative Care   Discharge Exam: BP 80/45 mmHg  Pulse 52  Temp(Src) 92.9 F (33.8 C) (Rectal)  Resp 20  Ht _0  (1.702 m)  Wt 63.6 kg (140 lb 3.4 oz)  BMI 21.96 kg/m2  SpO2 96% General:  Sleepy, somnolent, not  responsive to simulus Neck: supple, no masses, trachea mildline  Cardiovascular: RRR, no murmurs or rubs, no peripheral edema  Respiratory: clear to auscultation bilaterally, no wheezes, no crackles  Abdomen: soft, tender, nondistended, normal bowel tones heard  Skin: dry, no rashes  Musculoskeletal: no joint effusions, normal range of motion  Psychiatric: appropriate affect, normal speech  Neurologic: extraocular muscles intact, clear speech, moving all extremities with intact sensorium   Discharge Instructions You were cared for by a hospitalist during your hospital stay. If you have any questions about your discharge medications or the care you received while you were in the hospital after you are discharged, you can call the unit and asked to speak with the hospitalist on call if the hospitalist that took care of you is not available. Once you are discharged, your primary care physician will handle any further medical issues. Please note that NO REFILLS for any discharge medications will be authorized once you are discharged, as it is imperative that you return to your primary care physician (or establish a relationship with a primary care physician if you do not have one) for your aftercare needs so that they can reassess your need for medications and monitor your lab values.  Discharge Instructions    Diet - low sodium heart healthy    Complete by:  As directed      Increase activity slowly    Complete by:  As directed             Medication List    STOP taking these medications        aspirin 81 MG tablet     carvedilol 6.25 MG tablet  Commonly known as:  COREG     Dorzolamide HCl-Timolol Mal PF 22.3-6.8 MG/ML Soln     folic acid 1 MG tablet  Commonly known as:  FOLVITE     insulin aspart 100 UNIT/ML injection  Commonly known as:  novoLOG     latanoprost 0.005 % ophthalmic solution  Commonly known as:  XALATAN     levothyroxine 88 MCG tablet  Commonly known as:   SYNTHROID, LEVOTHROID     MENS MULTIVITAMIN PLUS PO     sodium bicarbonate 650 MG tablet     tamsulosin 0.4 MG Caps capsule  Commonly known as:  FLOMAX     UNABLE TO FIND       No Known Allergies    The results of significant diagnostics from this hospitalization (including imaging, microbiology, ancillary and laboratory) are listed below for reference.    Significant Diagnostic Studies: Ct Abdomen Pelvis Wo Contrast  08/18/2015  CLINICAL DATA:  Weakness. Altered mental status. Hypotension. History of colon cancer and colectomy. EXAM: CT ABDOMEN AND PELVIS WITHOUT CONTRAST TECHNIQUE: Multidetector CT imaging of the abdomen and pelvis was performed following the standard protocol without IV contrast. COMPARISON:  Abdomen ultrasound dated 08/12/2015. FINDINGS: Lower chest: Moderate size bilateral pleural effusions. Bilateral lower lobe atelectasis. Hepatobiliary: Large number of masses throughout the liver. Large number of small gallstones in the gallbladder, all measuring less than 5 mm in diameter each. Some contain nitrogen. No gallbladder wall thickening or pericholecystic fluid. Pancreas: Diffuse atrophy of the neck and body of  the pancreas. The head and tail are normal in caliber. No visible mass or stone. Spleen: Scattered tiny calcified granulomata or vascular calcifications. Normal in size. Adrenals/Urinary Tract: Normal appearing adrenal glands, kidneys, ureters and urinary bladder. No calculi or hydronephrosis. No visible mass. Stomach/Bowel: Surgically absent right colon and hepatic flexure with a small bowel to colonic anastomosis adjacent to the gallbladder there is also a probable gas-filled diverticulum adjacent to the gallbladder. No definite gas within the gallbladder. Vascular/Lymphatic: Extensive atheromatous arterial calcifications. These include dense coronary artery calcifications. No enlarged lymph nodes. Reproductive: Moderately enlarged prostate gland. Other: None.  Musculoskeletal: Extensive lumbar and lower thoracic spine degenerative changes. IMPRESSION: 1. Large number of metastases throughout the liver. 2. Moderate-sized bilateral pleural effusions. 3. Bilateral lower lobe atelectasis. 4. Cholelithiasis. 5. Sigmoid diverticulosis. 6. Surgical absence of the right colon and hepatic flexure. 7. Dense atheromatous coronary artery calcifications. 8. Moderately enlarged prostate gland. Electronically Signed   By: Claudie Revering M.D.   On: 08/18/2015 17:50   Dg Chest 2 View  08/18/2015  CLINICAL DATA:  Weakness.  Hypotension.  Altered mental status. EXAM: CHEST  2 VIEW COMPARISON:  08/10/2015. FINDINGS: Poor inspiration. Normal sized heart with an interval decrease in size. The moderate sized bilateral pleural effusions, increased. Interval patchy opacity at the right lung base and linear and mild patchy opacity at the left lung base. No acute bony abnormality. IMPRESSION: 1. Interval pneumonia or patchy atelectasis of the right lung base. 2. Left basilar atelectasis and possible pneumonia. 3. Moderate-sized bilateral pleural effusions, increased. Electronically Signed   By: Claudie Revering M.D.   On: 08/18/2015 16:32   Dg Chest 2 View  08/10/2015  CLINICAL DATA:  Followup of pneumonia. EXAM: CHEST  2 VIEW COMPARISON:  08/09/2015 FINDINGS: Both lateral views are degraded posteriorly. Midline trachea. Mild cardiomegaly with a tortuous, calcified thoracic aorta. Although no pleural fluid is identified on the frontal radiograph, there is posterior increased opacity on the lateral view. This is not well localized on the frontal radiograph. No well-defined airspace opacity identified. IMPRESSION: Cardiomegaly without congestive failure. Increase in posterior opacity on the lateral view. Not well localized on the frontal. Likely a combination of loculated pleural fluid and adjacent atelectasis or infection. Electronically Signed   By: Abigail Miyamoto M.D.   On: 08/10/2015 12:46   Dg  Chest 2 View  08/09/2015  CLINICAL DATA:  Hypoglycemia.  Fatigue.  Fall. EXAM: CHEST  2 VIEW COMPARISON:  03/27/2008 chest radiograph. FINDINGS: Stable cardiomediastinal silhouette with normal heart size. No pneumothorax. No pleural effusion. There is patchy opacity overlying the lower thoracic spine on the lateral view, probably correlating with a right lower lobe on the frontal view. No pulmonary edema. IMPRESSION: Patchy right lower lobe opacity, nonspecific, cannot exclude a right lower lobe pneumonia. Recommend follow-up PA and lateral post treatment chest radiographs in 4-6 weeks. Electronically Signed   By: Ilona Sorrel M.D.   On: 08/09/2015 18:45   Ct Head Wo Contrast  08/18/2015  CLINICAL DATA:  Altered mental status EXAM: CT HEAD WITHOUT CONTRAST TECHNIQUE: Contiguous axial images were obtained from the base of the skull through the vertex without intravenous contrast. COMPARISON:  None. FINDINGS: The bony calvarium is intact. No gross soft tissue abnormality is noted. Mild atrophic changes are identified stable from the prior exam. No findings to suggest acute hemorrhage, acute infarction or space-occupying mass lesion are noted. IMPRESSION: Chronic atrophic changes without acute abnormality. Electronically Signed   By: Linus Mako.D.  On: 08/18/2015 17:32   Ct Head Wo Contrast  08/09/2015  CLINICAL DATA:  Unresponsive EXAM: CT HEAD WITHOUT CONTRAST TECHNIQUE: Contiguous axial images were obtained from the base of the skull through the vertex without intravenous contrast. COMPARISON:  None. FINDINGS: Bony calvarium is intact. No gross soft tissue abnormality is seen. Paranasal sinuses are within normal limits. Diffuse atrophic changes are noted. No findings to suggest acute hemorrhage, acute infarction or space-occupying mass lesion are noted. IMPRESSION: Atrophic changes without acute abnormality. Electronically Signed   By: Inez Catalina M.D.   On: 08/09/2015 19:05   US Abdomen  Complete  08/12/2015  CLINICAL DATA:  Acute renal insufficiency as well as elevated LFTs. Previous colectomy for colon cancer. EXAM: ABDOMEN ULTRASOUND COMPLETE COMPARISON:  08/15/2001 FINDINGS: Gallbladder: Moderate cholelithiasis as the largest stone measures 1 cm. No gallbladder wall thickening. No pericholecystic fluid and negative sonographic Murphy sign. Common bile duct: Diameter: 3.9 mm. Liver: There is diffuse heterogeneity without definite focal mass. There is nodularity to the liver contour suggesting cirrhosis. There is mild ascites. IVC: No abnormality visualized. Pancreas: Visualized portion unremarkable. Spleen: Size and appearance within normal limits. Right Kidney: Length: 9.7 cm. Generalized cortical thinning. Echogenicity within normal limits. No mass or hydronephrosis visualized. Left Kidney: Length: 0.4 cm. Echogenicity within normal limits. No mass or hydronephrosis visualized. Abdominal aorta: Mild-to-moderate atherosclerotic disease without evidence of aneurysm. Other findings: Small bilateral pleural effusions. IMPRESSION: Moderate cholelithiasis without additional sonographic evidence to suggest cholecystitis. Findings suggesting cirrhosis with mild ascites. Small bilateral pleural effusions. Kidneys normal size without hydronephrosis. Mild right renal cortical thinning. Electronically Signed   By: Marin Olp M.D.   On: 08/12/2015 14:31    Microbiology: Recent Results (from the past 240 hour(s))  Blood culture (routine x 2)     Status: None (Preliminary result)   Collection Time: 08/18/15  4:09 PM  Result Value Ref Range Status   Specimen Description BLOOD LEFT ANTECUBITAL  Final   Special Requests BOTTLES DRAWN AEROBIC AND ANAEROBIC 5CC  Final   Culture   Final    NO GROWTH 2 DAYS Performed at [facility]    Report Status PENDING  Incomplete  Blood culture (routine x 2)     Status: None (Preliminary result)   Collection Time: 08/18/15  4:14 PM  Result Value Ref  Range Status   Specimen Description BLOOD LEFT ARM  Final   Special Requests BOTTLES DRAWN AEROBIC AND ANAEROBIC 5CC  Final   Culture   Final    NO GROWTH 2 DAYS Performed at [facility]    Report Status PENDING  Incomplete  Culture, Urine     Status: Abnormal   Collection Time: 08/18/15  5:53 PM  Result Value Ref Range Status   Specimen Description URINE, CATHETERIZED  Final   Special Requests NONE  Final   Culture MULTIPLE SPECIES PRESENT, SUGGEST RECOLLECTION (A)  Final   Report Status 08/20/2015 FINAL  Final  MRSA PCR Screening     Status: None   Collection Time: 08/18/15  8:46 PM  Result Value Ref Range Status   MRSA by PCR NEGATIVE NEGATIVE Final    Comment:        The GeneXpert MRSA Assay (FDA approved for NASAL specimens only), is one component of a comprehensive MRSA colonization surveillance program. It is not intended to diagnose MRSA infection nor to guide or monitor treatment for MRSA infections.      Labs: Basic Metabolic Panel:  Recent Labs Lab 08/18/15  1605 08/18/15 1629 08/19/15 0421 08/20/15 0533 08/21/15 0911  NA 135 135 135 136 137  K 4.3 4.3 4.7 4.8 5.1  CL 106 105 109 110 111  CO2 15*  --  13* 13* 12*  GLUCOSE 96 87 131* 107* 132*  BUN 105* 100* 110* 108* 109*  CREATININE 4.94* 5.00* 4.52* 4.62* 5.10*  CALCIUM 8.4*  --  7.9* 8.1* 8.1*   Liver Function Tests:  Recent Labs Lab 08/18/15 1605 08/19/15 0421  AST 121* 143*  ALT 55 59  ALKPHOS 345* 304*  BILITOT 1.3* 1.6*  PROT 5.0* 4.5*  ALBUMIN 2.1* 1.9*   No results for input(s): LIPASE, AMYLASE in the last 168 hours. No results for input(s): AMMONIA in the last 168 hours. CBC:  Recent Labs Lab 08/18/15 1605 08/18/15 1629 08/19/15 0421  WBC 9.8  --  9.6  NEUTROABS 7.8*  --  8.1*  HGB 10.3* 10.9* 10.3*  HCT 29.6* 32.0* 29.1*  MCV 89.7  --  90.4  PLT 138*  --  118*   Cardiac Enzymes:  Recent Labs Lab 08/18/15 2102 08/19/15 0344 08/19/15 0919  TROPONINI  0.10* 0.10* 0.09*   BNP: BNP (last 3 results) No results for input(s): BNP in the last 8760 hours.  ProBNP (last 3 results) No results for input(s): PROBNP in the last 8760 hours.  CBG:  Recent Labs Lab 08/20/15 0751 08/20/15 1151 08/20/15 1626 08/21/15 0005 08/21/15 0533  GLUCAP 99 107* 105* 123* 117*    Time spent: 35 minutes were spent in preparing this discharge including medication reconciliation, counseling, and coordination of care.  Signed:  [employer]  [facility] 08/21/2015, 2:09 PM

## 2015-08-21 NOTE — Progress Notes (Signed)
Daily Progress Note   Patient Name: Kyle Parrish       Date: 08/21/2015 DOB: 11-29-1912  Age: 82 y.o. MRN#: [id number] Attending Physician: Mir Marry Guan,* Primary Care Physician: Wyatt Haste, MD Admit Date: 08/18/2015  Reason for Consultation/Follow-up: Establishing goals of care  Subjective: I met today with patient, his daughter, and his son-in-law. Mr. Gullett appears to be more withdrawn today and does not really meaningfully interact during encounter.  I spoke with his daughter and son-in-law at length. We reviewed his clinical condition to this point in time including changes in the last 24 hours which include him being more withdrawn and less responsive, declining by mouth intake despite being offered food or drink, and demonstrating worsening of his kidney function on labs drawn this morning.  His daughter reports that she believes he is dying and that his desire if he were to understand his current situation would be to focus strictly on care related to his comfort.  We discussed options moving forward with the understanding that he is approaching the end-of-life, and she believes that he would best be served by pursuing placement at residential hospice.  Length of Stay: 3  Current Medications: Scheduled Meds:  . latanoprost  1 drop Both Eyes QHS    Continuous Infusions:    PRN Meds: acetaminophen **OR** acetaminophen, bisacodyl, glycopyrrolate **OR** glycopyrrolate **OR** glycopyrrolate, haloperidol **OR** haloperidol **OR** haloperidol lactate, HYDROmorphone (DILAUDID) injection, LORazepam **OR** LORazepam **OR** LORazepam, ondansetron **OR** ondansetron (ZOFRAN) IV, oxyCODONE **OR** oxyCODONE  Physical Exam         General: Elderly male lying in  bed. He does not seem to interact meaningfully for any period of time which is a change compared to yesterday. He does not verbalize during the encounter today HEENT: No bruits, no goiter, no JVD Heart: Regular rate and rhythm. No murmur appreciated. Lungs: Fair air movement, scattered rales in bases Abdomen: Soft, nontender, nondistended, positive bowel sounds.  Ext: No significant edema Skin: Warm and dry Neuro: Is not really participating in exam today  Vital Signs: BP 80/45 mmHg  Pulse 111  Temp(Src) 92.9 F (33.8 C) (Rectal)  Resp 22  Ht '5\' 7"'$  (1.702 m)  Wt 63.6 kg (140 lb 3.4 oz)  BMI 21.96 kg/m2  SpO2 90% SpO2: SpO2: 90 % O2 Device: O2 Device: Nasal Cannula  O2 Flow Rate: O2 Flow Rate (L/min): 2 L/min  Intake/output summary:  Intake/Output Summary (Last 24 hours) at 08/21/15 1236 Last data filed at 08/21/15 0615  Gross per 24 hour  Intake 1305.83 ml  Output    150 ml  Net 1155.83 ml   LBM: Last BM Date: 08/19/15 Baseline Weight: Weight: 61.054 kg (134 lb 9.6 oz) Most recent weight: Weight: 63.6 kg (140 lb 3.4 oz)       Palliative Assessment/Data:    Flowsheet Rows        Most Recent Value   Intake Tab    Referral Department  Hospitalist   Unit at Time of Referral  Cardiac/Telemetry Unit   Palliative Care Primary Diagnosis  Sepsis/Infectious Disease   Date Notified  08/19/15   Palliative Care Type  New Palliative care   Reason for referral  Clarify Goals of Care   Date of Admission  08/18/15   Date first seen by Palliative Care  08/19/15   # of days Palliative referral response time  0 Day(s)   # of days IP prior to Palliative referral  1   Clinical Assessment    Palliative Performance Scale Score  30%   Pain Max last 24 hours  Not able to report   Pain Min Last 24 hours  Not able to report   Psychosocial & Spiritual Assessment    Palliative Care Outcomes    Patient/Family meeting held?  Yes   Who was at the meeting?  daughter   [place] goals of care, Counseled regarding hospice      Patient Active Problem List   Diagnosis Date Noted  . Pressure ulcer 08/20/2015  . Aspiration pneumonia ([place]) 08/18/2015  . Normocytic anemia 08/18/2015  . Hypotension 08/18/2015  . Paroxysmal atrial fibrillation ([place]) 08/18/2015  . Acute renal failure superimposed on stage 3 chronic kidney disease ([place]) 08/13/2015  . Cirrhosis of liver ([place]) 08/13/2015  . Thrombocytopenia ([place]) 08/13/2015  . Hypoglycemia 08/09/2015  . Elevated troponin 08/09/2015  . Hyperkalemia 08/09/2015  . Hyponatremia 08/09/2015  . Arthritis 05/02/2013  . History of Ischemic cardiomyopathy   . Hyperlipidemia associated with type 2 diabetes mellitus ([place]) 04/06/2011  . Type 2 diabetes with complication ([place]) 62/22/9798  . Hypertension associated with diabetes ([place]) 08/03/2010  . Hypothyroid 08/03/2010  . History of colon cancer 08/03/2010  . Tinea pedis 07/09/2010  . Coronary artery disease, with PCI of the LAD; following an anterior MI treated medically. 09/02/2001    Palliative Care Assessment & Plan   Assessment: 82 year old male with aspiration pneumonia and kidney failure  Recommendations/Plan:  Full comfort care.  Orders placed via end-of-life order set.  Dyspnea: With his kidney failure, will plan for oxycodone sublingual as needed with backup medication of Dilaudid via IV route if symptoms worsen or acutely develop.  Anxiety: Ativan as needed  Agitation: Haldol as needed  Excess secretions: Robinul as needed  His daughter is in agreement with focus strictly on comfort moving forward. We discussed options for care at the end-of-life and she would like to pursue placement at residential hospice for end-of-life care. I believe his prognosis will be a matter of days at best and he has high risk of developing worsening symptoms, particularly acute onset of dyspnea. He will be well served by residential hospice if this can be  arranged.  Goals of Care and Additional Recommendations:  Limitations on Scope of Treatment: Full Comfort Care  Code Status:    Code  Status Orders        Start     Ordered   08/21/15 1219  Do not attempt resuscitation (DNR)   Continuous    Question Answer Comment  In the event of cardiac or respiratory ARREST Do not call a "code blue"   In the event of cardiac or respiratory ARREST Do not perform Intubation, CPR, defibrillation or ACLS   In the event of cardiac or respiratory ARREST Use medication by any route, position, wound care, and other measures to relive pain and suffering. May use oxygen, suction and manual treatment of airway obstruction as needed for comfort.      08/21/15 1223    Code Status History    Date Active Date Inactive Code Status Order ID Comments User Context   08/18/2015  6:44 PM 08/21/2015 12:23 PM DNR [id number]  Vianne Bulls, MD ED   08/13/2015  9:33 AM 08/13/2015  5:31 PM DNR [id number]  Louellen Molder, MD Inpatient   08/10/2015  8:11 AM 08/13/2015  9:33 AM Full Code [id number]  Theressa Millard, MD Inpatient    Advance Directive Documentation        Most Recent Value   Type of Advance Directive  Out of facility DNR (pink MOST or yellow form)   Pre-existing out of facility DNR order (yellow form or pink MOST form)     "MOST" Form in Place?         Prognosis:   Hours - Days.  He has aspiration pneumonia, worsening kidney failure, and minimal PO intake (limited to single ice cubes).  With a focus strictly on comfort, I would anticipate his prognosis to be a matter of days at best, however he remains at high risk of acute decompensation and death at any point.  Discharge Planning:  Hospice facility.  I am concerned that he will continue to progress in symptoms, particularly respiratory distress and dyspnea.  Currently this is not problematic, but he has known aspiration PNA, is getting PO intake as desired for comfort, and is at high risk for worsening  symptoms or another aspiration event.  He would benefit from close monitoring and titration of medications for dyspnea moving forward.  Care plan was discussed with patient's daughter and son in law, bedside nurse, and social work. Attending notified via text page.  Thank you for allowing the Palliative Medicine Team to assist in the care of this patient.   Time In: 1135 Time Out: 1215 Total Time 40 Prolonged Time Billed no      Greater than 50%  of this time was spent counseling and coordinating care related to the above assessment and plan.  Micheline Rough, MD  Please contact Palliative Medicine Team phone at [phone] for questions and concerns.

## 2015-08-21 NOTE — Progress Notes (Signed)
Speech Language Pathology Treatment: Dysphagia  Patient Details Name: Kyle Parrish MRN: [id number] DOB: [birth year] Today's Date: 08/21/2015 Time: [id number] SLP Time Calculation (min) (ACUTE ONLY): 10 min  Assessment / Plan / Recommendation Clinical Impression  Pt presented with prolonged oral transit/prolonged manipulation/propulsion with eventual swallow response (severely delayed) with total cues provided to swallow ice chips; confused/lethargic throughout PO trial; palliative consult obtained, pt refusing PO intake; ST will f/u x1 for determination of NPO vs least restrictive diet following discussion with family/staff re: POC   HPI HPI: 82 yo male adm to [facility] with 4 days of weakness.  Pt diagnosed with aspiration pna.  PMH + for dysphagia with diet being soft/nectar at facility, CT abdomen with liver mets and moderate pleural effusion.  Pt with poor prognosis per notes and palliative referral pending.  Swallow evaluation.        SLP Plan  Continue with current plan of care     Recommendations  Diet recommendations: NPO;Other(comment) (ice chips/room temp water prn) Liquids provided via: Teaspoon Medication Administration: Via alternative means Supervision: Full supervision/cueing for compensatory strategies Compensations: Minimize environmental distractions;Slow rate;Small sips/bites Postural Changes and/or Swallow Maneuvers: Seated upright 90 degrees             Oral Care Recommendations: Oral care QID Follow up Recommendations: None Plan: Continue with current plan of care                    ADAMS,PAT, M.S., CCC-SLP 08/21/2015, 11:12 AM

## 2015-08-21 NOTE — Care Management Important Message (Signed)
Important Message  Patient Details  Name: Kyle Parrish MRN: [id number] Date of Birth: [birth year]   Medicare Important Message Given:  Yes    Camillo Flaming 08/21/2015, 1:04 [place] Message  Patient Details  Name: Kyle Parrish MRN: [id number] Date of Birth: 03/16/1912   Medicare Important Message Given:  Yes    Camillo Flaming 08/21/2015, 1:04 PM

## 2015-08-21 NOTE — Progress Notes (Signed)
Chaplain present for support around discharge to hospice.  Kyle Parrish was being cared for by nursing at time of chaplain arrival.  Bonney Roussel unable to speak with pt at this time, Will follow up today to assess for support.   [place], [place]

## 2015-08-21 NOTE — Care Management Note (Signed)
Case Management Note  Patient Details  Name: Kyle Parrish MRN: [id number] Date of Birth: Aug 03, 1912  Subjective/Objective:PMT-residential hospice facility. CSW following.                    Action/Plan:d/c residential hospice facility.   Expected Discharge Date:   (unknown)               Expected Discharge Plan:  [place]  In-House Referral:  Clinical Social Work  Discharge planning Services  CM Consult  Post Acute Care Choice:    Choice offered to:     DME Arranged:    DME Agency:     HH Arranged:    [place] Agency:     Status of Service:  In process, will continue to follow  Medicare Important Message Given:  Yes Date Medicare IM Given:    Medicare IM give by:    Date Additional Medicare IM Given:    Additional Medicare Important Message give by:     If discussed at [place] of Stay Meetings, dates discussed:    Additional Comments:  Dessa Phi, RN 08/21/2015, 1:40 PM

## 2015-08-21 NOTE — Progress Notes (Signed)
CSW received consult from Dr. Domingo Cocking for residential hospice facility placement. CSW spoke with patient's daughter, Mardene Celeste at bedside to confirm that their first choice is [facility]. CSW made referral to Collie Siad at [place], awaiting call back from [facility] re: bed availability/eligibility.    Raynaldo Opitz, [facility] Clinical Social Worker cell #: [phone]

## 2015-08-21 NOTE — Consult Note (Signed)
   [facility] CM Inpatient Consult   08/21/2015  KHYLIL DEGNER [birth year] [place]:[id number]    Patient screened for potential [facility] Care Management services. Chart reviewed. Noted discharge plan is for [facility]. Will not attempt to engage for [place] Management services.    Marthenia Rolling, MSN-Ed, RN,BSN [facility] Liaison [phone]

## 2015-08-21 NOTE — Progress Notes (Signed)
[facility] RN Liason note   Request received from Raynaldo Opitz, [place] regarding family interest in [facility] with request to transfer today.    Chart reviewed with [facility] medical director.   Patient is eligible and there is a bed currently available.   TC made to patient's daughter Mardene Celeste to explain services and confirm interest.  Daughter is in agreement with hospice and is agreeable to transfer patient to [facility] today.   Hospital liason Lanetta Inch Carlye Grippe is currently en route to the daughters place of employment to complete registration paperwork and will notify CSW when paperwork is completed.    Please fax discharge summary to [phone] when available.   RN please call report to [phone].  Thank you for this referral. Please call with any questions.    Mickie Kay, [facility] Liason  [phone]

## 2015-08-21 NOTE — Progress Notes (Signed)
[facility] Liaison:  ° °Received request from CSW for family interest in [facility]. Chart reviewed. [facility] room available today. Met with daughter at 2:30 PM to complete paper work for transfer today.  °? °Please fax discharge summary to [phone]. °? °RN please call report to [phone]. °? °Please arrange transport for as soon as possible.  °? °Thank you, °Stacie Wilkinson RN, BSN °[facility] Liaison °[phone] ° °

## 2015-08-21 NOTE — Progress Notes (Signed)
Patient is set to discharge to [facility] today. Patient & daughter, Mardene Celeste aware. Discharge packet given to RN, [place]. [facility] called for transport.     Raynaldo Opitz, [facility] Clinical Social Worker cell #: [phone]

## 2015-08-23 LAB — CULTURE, BLOOD (ROUTINE X 2)
CULTURE: NO GROWTH
CULTURE: NO GROWTH

## 2015-09-13 DEATH — deceased

## 2017-07-15 IMAGING — CT CT HEAD W/O CM
4 series · 19 of 47 positions shown, 21 images · non-contrast
Comparison: None.

CLINICAL DATA: Unresponsive

EXAM:
CT HEAD WITHOUT CONTRAST
TECHNIQUE: Contiguous axial images were obtained from the base of the skull
through the vertex without intravenous contrast.

[Series 201: head w/o, idose (1) · axial · non-contrast · 0.49mm/px · z∈[+71,+191]mm · 8 of 32 slices shown, 10 images]
[im 4/32  brain]
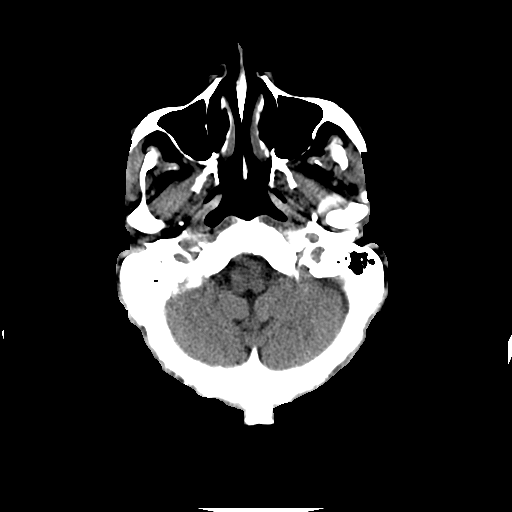
[im 4/32  bone]
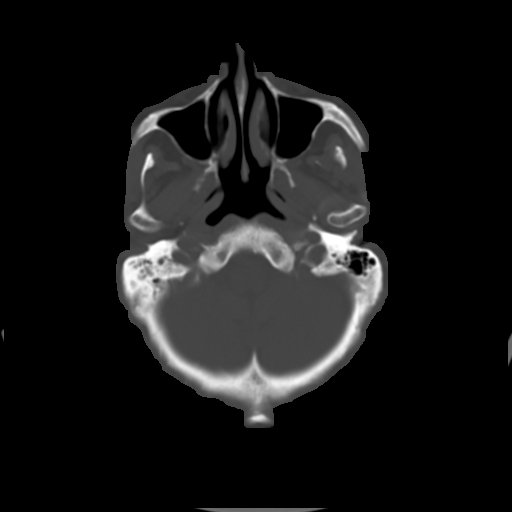
[im 7/32  brain]
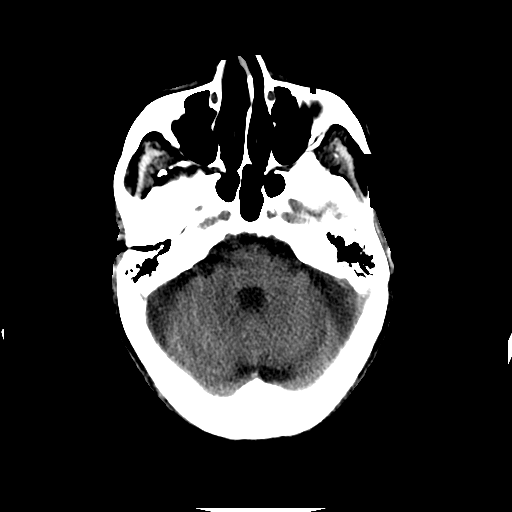
[im 11/32  brain]
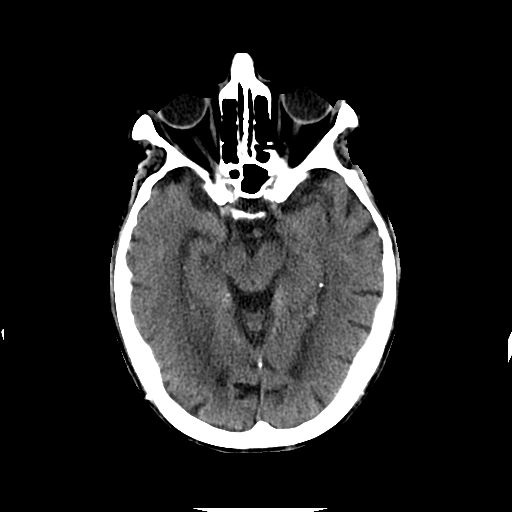
[im 14/32  brain]
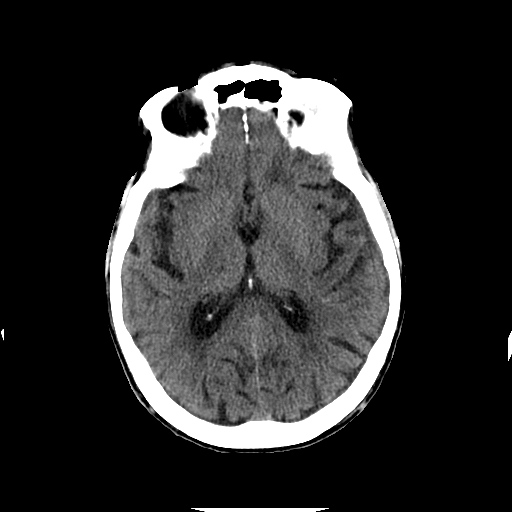
[im 18/32  brain]
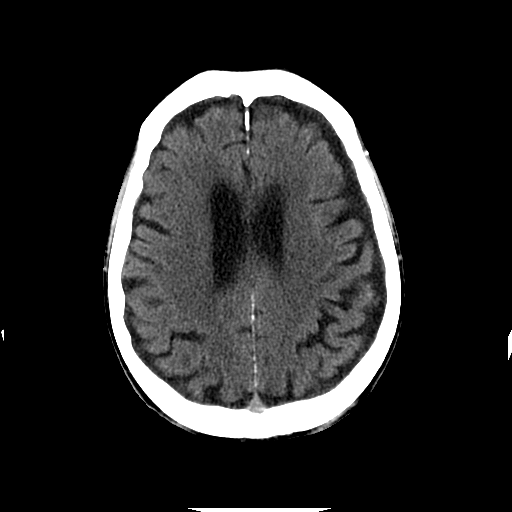
[im 18/32  bone]
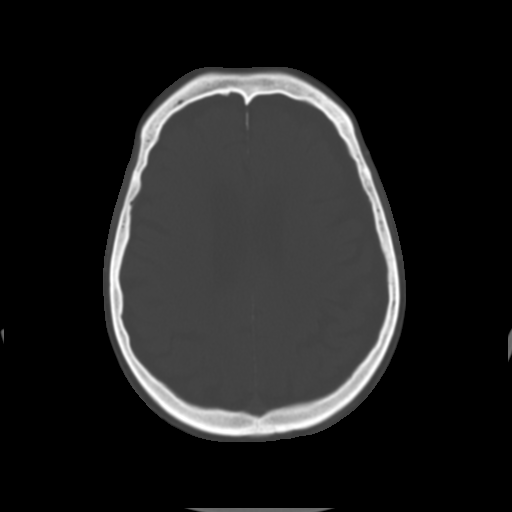
[im 21/32  brain]
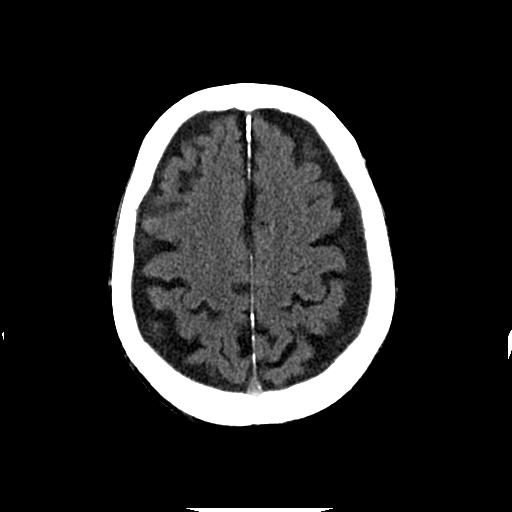
[im 25/32  brain]
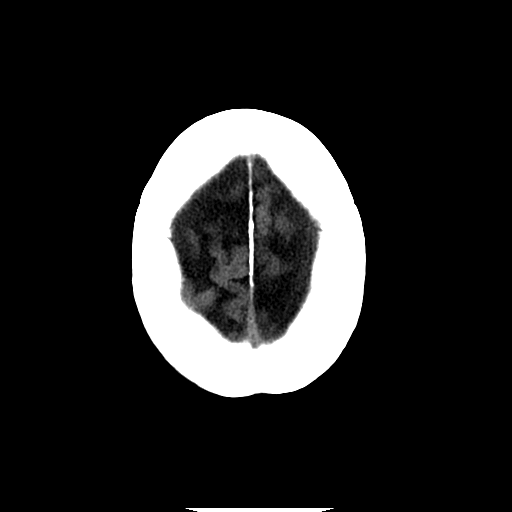
[im 28/32  brain]
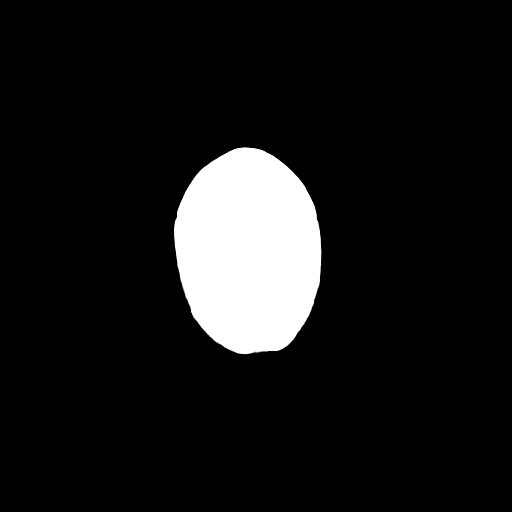

[Series 202: head w/o bone, idose (1) · axial · non-contrast · 0.49mm/px · z∈[+69,+144]mm · 5 of 64 slices shown]
[im 7/64  bone]
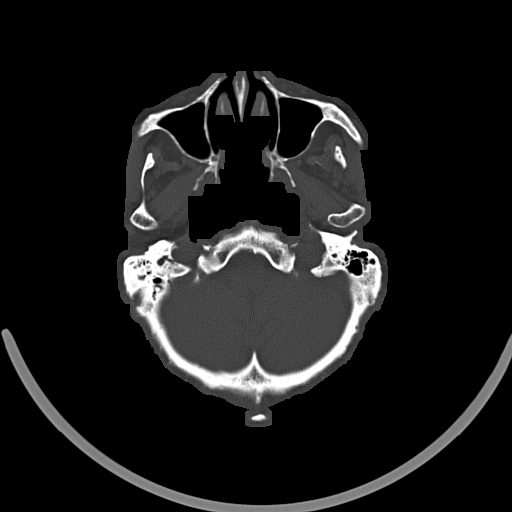
[im 14/64  bone]
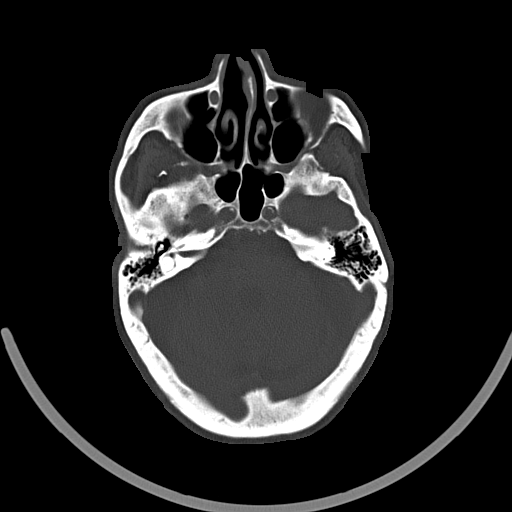
[im 20/64  bone]
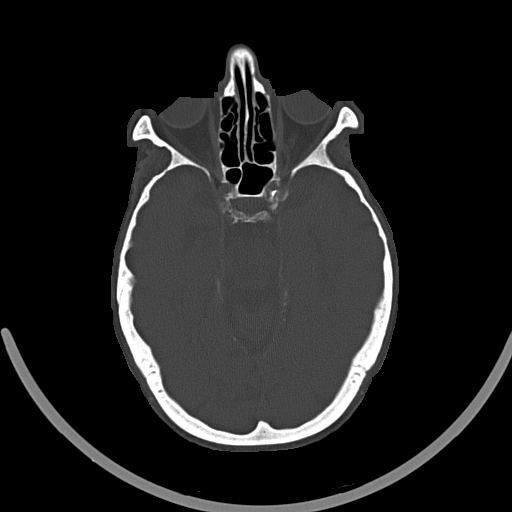
[im 27/64  bone]
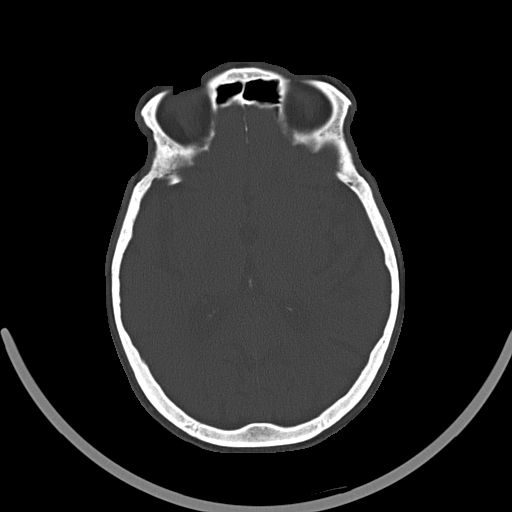
[im 37/64  bone]
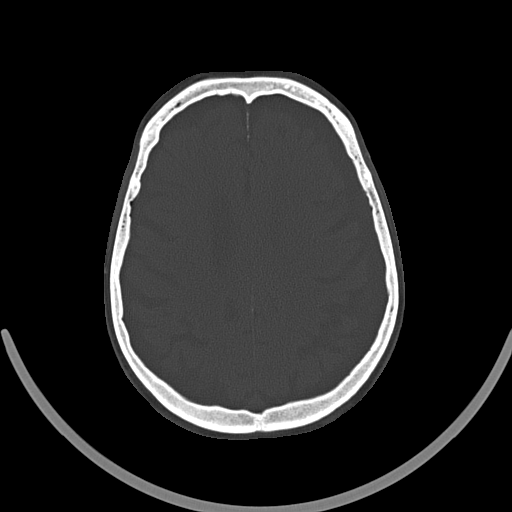

[Series 203: coronal st, idose (1) · coronal · 0.38mm/px · 3 of 65 slices shown]
[im 22/65  brain]
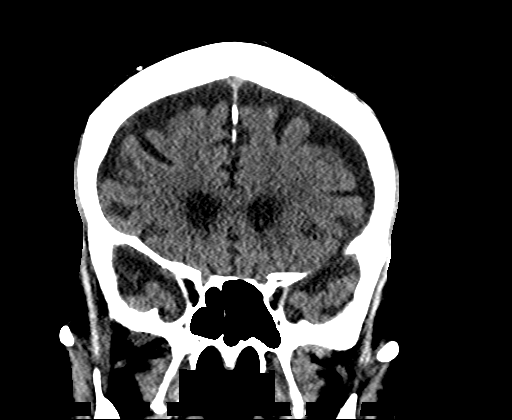
[im 29/65  brain]
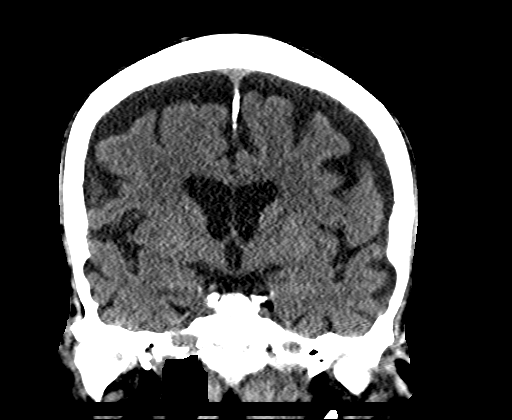
[im 36/65  brain]
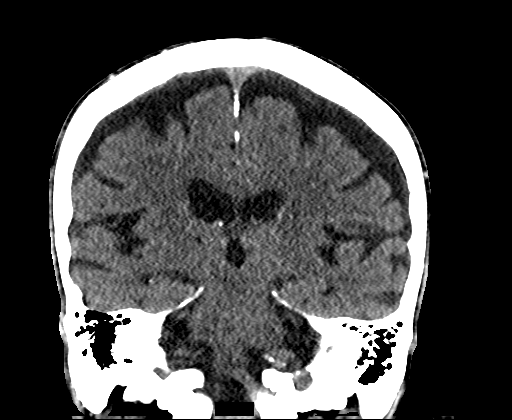

[Series 204: sagittal st, idose (1) · sagittal · 0.38mm/px · 3 of 65 slices shown]
[im 22/65  brain]
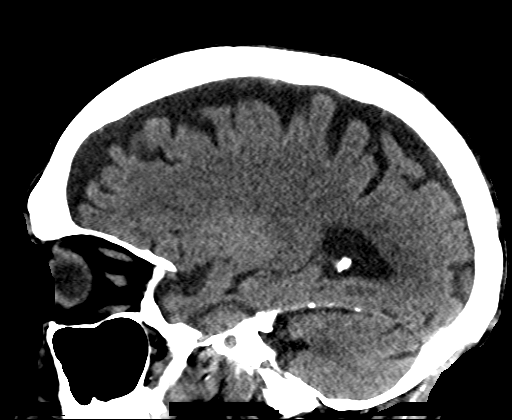
[im 33/65  brain]
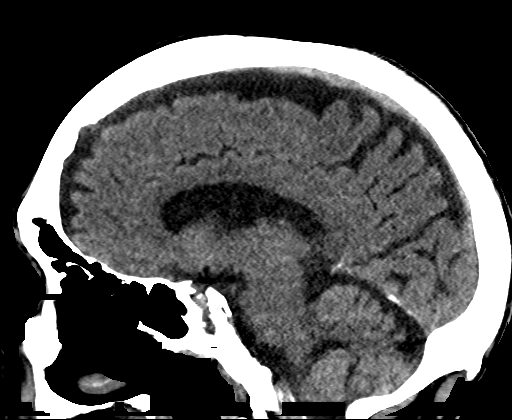
[im 43/65  brain]
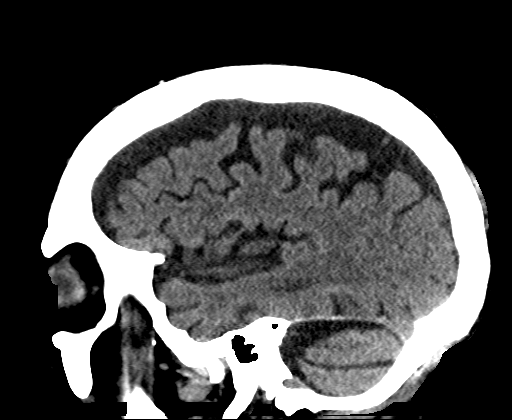

[19 of 47 positions shown; findings below may reference images not displayed]

FINDINGS: Bony calvarium is intact. No gross soft tissue abnormality is seen.
Paranasal sinuses are within normal limits. Diffuse atrophic changes
are noted. No findings to suggest acute hemorrhage, acute infarction
or space-occupying mass lesion are noted.
IMPRESSION: Atrophic changes without acute abnormality.
